# Patient Record
Sex: Male | Born: 1940 | Race: White | Hispanic: No | Marital: Married | Smoking: Former smoker
Health system: Southern US, Community
[De-identification: ages and names within clinical notes are randomized; demographics above are authoritative.]

## PROBLEM LIST (undated history)

## (undated) DIAGNOSIS — E119 Type 2 diabetes mellitus without complications: Secondary | ICD-10-CM

## (undated) DIAGNOSIS — I10 Essential (primary) hypertension: Secondary | ICD-10-CM

## (undated) DIAGNOSIS — N400 Enlarged prostate without lower urinary tract symptoms: Secondary | ICD-10-CM

## (undated) HISTORY — PX: CORONARY ARTERY BYPASS GRAFT: SHX141

## (undated) HISTORY — PX: HEMORRHOID SURGERY: SHX153

## (undated) HISTORY — PX: PROSTATE SURGERY: SHX751

## (undated) HISTORY — PX: TONSILLECTOMY: SUR1361

## (undated) HISTORY — PX: CHOLECYSTECTOMY: SHX55

## (undated) HISTORY — PX: APPENDECTOMY: SHX54

---

## 2016-03-29 ENCOUNTER — Encounter (HOSPITAL_BASED_OUTPATIENT_CLINIC_OR_DEPARTMENT_OTHER): Payer: Self-pay

## 2016-03-29 ENCOUNTER — Emergency Department (HOSPITAL_BASED_OUTPATIENT_CLINIC_OR_DEPARTMENT_OTHER): Payer: Medicare HMO

## 2016-03-29 ENCOUNTER — Emergency Department (HOSPITAL_BASED_OUTPATIENT_CLINIC_OR_DEPARTMENT_OTHER)
Admission: EM | Admit: 2016-03-29 | Discharge: 2016-03-29 | Disposition: A | Discharge status: Home / Self Care | Payer: Medicare HMO | Attending: Emergency Medicine | Admitting: Emergency Medicine

## 2016-03-29 DIAGNOSIS — E1122 Type 2 diabetes mellitus with diabetic chronic kidney disease: Secondary | ICD-10-CM | POA: Diagnosis not present

## 2016-03-29 DIAGNOSIS — Z79899 Other long term (current) drug therapy: Secondary | ICD-10-CM | POA: Insufficient documentation

## 2016-03-29 DIAGNOSIS — R05 Cough: Secondary | ICD-10-CM

## 2016-03-29 DIAGNOSIS — N189 Chronic kidney disease, unspecified: Secondary | ICD-10-CM | POA: Insufficient documentation

## 2016-03-29 DIAGNOSIS — Z87891 Personal history of nicotine dependence: Secondary | ICD-10-CM | POA: Insufficient documentation

## 2016-03-29 DIAGNOSIS — I129 Hypertensive chronic kidney disease with stage 1 through stage 4 chronic kidney disease, or unspecified chronic kidney disease: Secondary | ICD-10-CM | POA: Insufficient documentation

## 2016-03-29 DIAGNOSIS — N289 Disorder of kidney and ureter, unspecified: Secondary | ICD-10-CM

## 2016-03-29 DIAGNOSIS — E1165 Type 2 diabetes mellitus with hyperglycemia: Secondary | ICD-10-CM

## 2016-03-29 DIAGNOSIS — R059 Cough, unspecified: Secondary | ICD-10-CM

## 2016-03-29 HISTORY — DX: Benign prostatic hyperplasia without lower urinary tract symptoms: N40.0

## 2016-03-29 HISTORY — DX: Essential (primary) hypertension: I10

## 2016-03-29 HISTORY — DX: Type 2 diabetes mellitus without complications: E11.9

## 2016-03-29 LAB — BASIC METABOLIC PANEL
ANION GAP: 13 (ref 5–15)
BUN: 29 mg/dL — ABNORMAL HIGH (ref 6–20)
CALCIUM: 8.5 mg/dL — AB (ref 8.9–10.3)
CO2: 27 mmol/L (ref 22–32)
CREATININE: 1.53 mg/dL — AB (ref 0.61–1.24)
Chloride: 97 mmol/L — ABNORMAL LOW (ref 101–111)
GFR, EST AFRICAN AMERICAN: 50 mL/min — AB (ref >60)
GFR, EST NON AFRICAN AMERICAN: 43 mL/min — AB (ref >60)
GLUCOSE: 240 mg/dL — AB (ref 65–99)
Potassium: 3.8 mmol/L (ref 3.5–5.1)
Sodium: 137 mmol/L (ref 135–145)

## 2016-03-29 LAB — CBC WITH DIFFERENTIAL/PLATELET
BASOS PCT: 0 %
Basophils Absolute: 0 10*3/uL (ref 0.0–0.1)
EOS ABS: 0.5 10*3/uL (ref 0.0–0.7)
Eosinophils Relative: 9 %
HEMATOCRIT: 37.6 % — AB (ref 39.0–52.0)
Hemoglobin: 12.5 g/dL — ABNORMAL LOW (ref 13.0–17.0)
LYMPHS ABS: 0.9 10*3/uL (ref 0.7–4.0)
LYMPHS PCT: 15 %
MCH: 29.5 pg (ref 26.0–34.0)
MCHC: 33.2 g/dL (ref 30.0–36.0)
MCV: 88.7 fL (ref 78.0–100.0)
MONO ABS: 0.4 10*3/uL (ref 0.1–1.0)
MYELOCYTES: 2 %
Monocytes Relative: 7 %
Neutro Abs: 4.2 10*3/uL (ref 1.7–7.7)
Neutrophils Relative %: 67 %
PLATELETS: 216 10*3/uL (ref 150–400)
RBC: 4.24 MIL/uL (ref 4.22–5.81)
RDW: 14.5 % (ref 11.5–15.5)
WBC: 6 10*3/uL (ref 4.0–10.5)

## 2016-03-29 LAB — TROPONIN I: Troponin I: <0.03 ng/mL (ref <0.031)

## 2016-03-29 LAB — URINALYSIS, ROUTINE W REFLEX MICROSCOPIC
Bilirubin Urine: NEGATIVE
GLUCOSE, UA: NEGATIVE mg/dL
HGB URINE DIPSTICK: NEGATIVE
Ketones, ur: NEGATIVE mg/dL
Nitrite: NEGATIVE
PH: 5 (ref 5.0–8.0)
PROTEIN: NEGATIVE mg/dL
SPECIFIC GRAVITY, URINE: 1.018 (ref 1.005–1.030)

## 2016-03-29 LAB — URINE MICROSCOPIC-ADD ON

## 2016-03-29 LAB — D-DIMER, QUANTITATIVE: D-Dimer, Quant: 1.15 ug/mL-FEU — ABNORMAL HIGH (ref 0.00–0.50)

## 2016-03-29 LAB — BRAIN NATRIURETIC PEPTIDE: B Natriuretic Peptide: 63.7 pg/mL (ref 0.0–100.0)

## 2016-03-29 MED ORDER — IPRATROPIUM-ALBUTEROL 0.5-2.5 (3) MG/3ML IN SOLN
3.0000 mL | Freq: Once | RESPIRATORY_TRACT | Status: AC
  Administered 2016-03-29: 3 mL via RESPIRATORY_TRACT
  Filled 2016-03-29: qty 3

## 2016-03-29 MED ORDER — BENZONATATE 100 MG PO CAPS
100.0000 mg | ORAL_CAPSULE | Freq: Three times a day (TID) | ORAL | Status: AC
Start: 2016-03-29 — End: ?

## 2016-03-29 MED ORDER — HYDROCOD POLST-CPM POLST ER 10-8 MG/5ML PO SUER: 5.0000 mL | Freq: Two times a day (BID) | ORAL | Status: DC

## 2016-03-29 MED ORDER — ALBUTEROL SULFATE HFA 108 (90 BASE) MCG/ACT IN AERS
2.0000 | INHALATION_SPRAY | Freq: Once | RESPIRATORY_TRACT | Status: AC
  Administered 2016-03-29: 2 via RESPIRATORY_TRACT
  Filled 2016-03-29: qty 6.7

## 2016-03-29 NOTE — ED Notes (Signed)
C/o prod cough x 10 days-NAD-steady gait

## 2016-03-29 NOTE — ED Provider Notes (Signed)
CSN: [id number]     Arrival date & time 03/29/16  1139 History   First MD Initiated Contact with Patient 03/29/16 1145     Chief Complaint  Patient presents with  . Cough   PT HAS HAD A COUGH FOR THE PAST 10 DAYS.  PT HAS BEEN ON CEFDINIR FOR A PROSTATE INFECTION.  PT SAID THAT HIS COUGH KEEPS HIM UP AT NIGHT.  PT TRIED HIS SISTER'S COUGH SYRUP WITH CODEINE AND THAT HAS NOT HELPED.  (Consider location/radiation/quality/duration/timing/severity/associated sxs/prior Treatment) Patient is a 75 y.o. male presenting with cough. The history is provided by the patient.  Cough Cough characteristics:  Productive Sputum characteristics:  Nondescript Severity:  Moderate Onset quality:  Gradual Timing:  Intermittent Progression:  Waxing and waning Chronicity:  New Smoker: no   Relieved by:  Nothing Ineffective treatments:  Cough suppressants and opioids   Past Medical History  Diagnosis Date  . Hypertension   . Enlarged prostate   . Diabetes mellitus without complication [facility])    Past Surgical History  Procedure Laterality Date  . Coronary artery bypass graft    . Prostate surgery    . Appendectomy    . Tonsillectomy    . Cholecystectomy    . Hemorrhoid surgery     No family history on file. Social History  Substance Use Topics  . Smoking status: Former Research scientist (life sciences)  . Smokeless tobacco: None  . Alcohol Use: No    Review of Systems  Respiratory: Positive for cough.   All other systems reviewed and are negative.     Allergies  Review of patient's allergies indicates no known allergies.  Home Medications   Prior to Admission medications   Medication Sig Start Date End Date Taking? Authorizing Provider  benzonatate (TESSALON) 100 MG capsule Take 1 capsule (100 mg total) by mouth every 8 (eight) hours. 03/29/16   Isla Pence, MD  chlorpheniramine-HYDROcodone [facility] PENNKINETIC ER) 10-8 MG/5ML SUER Take 5 mLs by mouth 2 (two) times daily. 03/29/16   Isla Pence, MD   UNKNOWN TO PATIENT Pt states he is on a lot of meds and did not bring list   Yes Historical Provider, MD   BP 124/80 mmHg  Pulse 92  Temp(Src) 98.9 F (37.2 C) (Oral)  Resp 22  Ht 5\' 9"  (1.753 m)  Wt 230 lb (104.327 kg)  BMI 33.95 kg/m2  SpO2 93% Physical Exam  Constitutional: He is oriented to person, place, and time. He appears well-developed and well-nourished.  HENT:  Head: Normocephalic and atraumatic.  Right Ear: External ear normal.  Left Ear: External ear normal.  Nose: Nose normal.  Mouth/Throat: Oropharynx is clear and moist.  Eyes: Conjunctivae and EOM are normal. Pupils are equal, round, and reactive to light.  Neck: Normal range of motion. Neck supple.  Cardiovascular: Normal rate, regular rhythm, normal heart sounds and intact distal pulses.   Pulmonary/Chest: Effort normal and breath sounds normal.  Abdominal: Soft. Bowel sounds are normal.  Musculoskeletal: Normal range of motion.  Neurological: He is alert and oriented to person, place, and time.  Skin: Skin is warm and dry.  Psychiatric: He has a normal mood and affect. His behavior is normal. Judgment and thought content normal.  Nursing note and vitals reviewed.   ED Course  Procedures (including critical care time) Labs Review Labs Reviewed  BASIC METABOLIC PANEL - Abnormal; Notable for the following:    Chloride 97 (*)    Glucose, Bld 240 (*)    BUN 29 (*)  Creatinine, Ser 1.53 (*)    Calcium 8.5 (*)    GFR calc non Af Amer 43 (*)    GFR calc Af Amer 50 (*)    All other components within normal limits  CBC WITH DIFFERENTIAL/PLATELET - Abnormal; Notable for the following:    Hemoglobin 12.5 (*)    HCT 37.6 (*)    All other components within normal limits  D-DIMER, QUANTITATIVE (NOT AT [facility]) - Abnormal; Notable for the following:    D-Dimer, Quant 1.15 (*)    All other components within normal limits  URINALYSIS, ROUTINE W REFLEX MICROSCOPIC (NOT AT [facility]) - Abnormal; Notable for the  following:    Leukocytes, UA MODERATE (*)    All other components within normal limits  URINE MICROSCOPIC-ADD ON - Abnormal; Notable for the following:    Squamous Epithelial / LPF 0-5 (*)    Bacteria, UA FEW (*)    All other components within normal limits  BRAIN NATRIURETIC PEPTIDE  TROPONIN I    Imaging Review Dg Chest 2 View  03/29/2016  CLINICAL DATA:  Productive cough, congestion for 10 days EXAM: CHEST  2 VIEW COMPARISON:  None. FINDINGS: There is no focal parenchymal opacity. There is no pleural effusion or pneumothorax. The heart and mediastinal contours are unremarkable. There is evidence of prior CABG. The osseous structures are unremarkable. IMPRESSION: No active cardiopulmonary disease. Electronically Signed   By: Kathreen Devoid   On: 03/29/2016 12:29   Ct Chest Wo Contrast  03/29/2016  CLINICAL DATA:  Productive cough for 10 days EXAM: CT CHEST WITHOUT CONTRAST TECHNIQUE: Multidetector CT imaging of the chest was performed following the standard protocol without IV contrast. COMPARISON:  Plain film from earlier in the same day. FINDINGS: Lungs are well aerated bilaterally. No focal infiltrate or sizable effusion is noted. The thoracic inlet demonstrates a small left thyroid nodule measuring less than 1 cm. Mild calcifications of the thoracic aorta are seen. Changes of prior coronary bypass grafting are noted. No hilar or mediastinal adenopathy is seen. The visualized upper abdomen is within normal limits. The osseous structures show degenerative change of the thoracic spine. IMPRESSION: No acute abnormality noted. Small left thyroid nodule. Electronically Signed   By: Inez Catalina M.D.   On: 03/29/2016 13:26   I have personally reviewed and evaluated these images and lab results as part of my medical decision-making.   EKG Interpretation   Date/Time:  Monday Mar 29 2016 12:13:00 EDT Ventricular Rate:  89 PR Interval:    QRS Duration: 129 QT Interval:  401 QTC Calculation:  488 R Axis:   78 Text Interpretation:  NSR WITH PACS Confirmed by Gilford Raid MD, Anadia Helmes  ([id number]) on 03/29/2016 12:22:34 PM Also confirmed by [facility] MD, Aniza Shor  [id number]), editor [place] CT, Leda Gauze [phone])  on 03/29/2016 2:00:23 PM      MDM  PT FEELS BETTER.  HE HAS NO SOB.  NO PERSISTENT COUGH.  THIS HAS BEEN GOING ON FOR DAYS.  HE DOES NOT KNOW HIS MEDS, SO I DON'T KNOW IF HE'S ON AN ACE.  D-DIMER WAS ELEVATED, BUT CRI PREVENTS CT WITH CONTRAST.  I HAVE A LOW SUSPICION FOR A PE, SO I WILL ORDER A VQ SCAN AS AN OUTPATIENT.  PT KNOWS TO RETURN IF WORSE AND TO F/U WITH PCP. Final diagnoses:  Cough  CRI (chronic renal insufficiency), unspecified stage  Poorly controlled type 2 diabetes mellitus (HCC)      Isla Pence, MD 03/29/16 1456

## 2016-03-29 NOTE — Discharge Instructions (Signed)

## 2020-10-16 ENCOUNTER — Encounter (HOSPITAL_BASED_OUTPATIENT_CLINIC_OR_DEPARTMENT_OTHER): Payer: Self-pay | Admitting: Emergency Medicine

## 2020-10-16 ENCOUNTER — Emergency Department (HOSPITAL_BASED_OUTPATIENT_CLINIC_OR_DEPARTMENT_OTHER): Payer: Medicare HMO

## 2020-10-16 ENCOUNTER — Other Ambulatory Visit: Payer: Self-pay

## 2020-10-16 ENCOUNTER — Inpatient Hospital Stay (HOSPITAL_BASED_OUTPATIENT_CLINIC_OR_DEPARTMENT_OTHER)
Admission: EM | Admit: 2020-10-16 | Discharge: 2020-10-20 | DRG: 177 | Disposition: A | Discharge status: Home / Self Care | Payer: Medicare HMO | Attending: Internal Medicine | Admitting: Internal Medicine

## 2020-10-16 DIAGNOSIS — I10 Essential (primary) hypertension: Secondary | ICD-10-CM | POA: Diagnosis present

## 2020-10-16 DIAGNOSIS — E872 Acidosis: Secondary | ICD-10-CM | POA: Diagnosis present

## 2020-10-16 DIAGNOSIS — Z7901 Long term (current) use of anticoagulants: Secondary | ICD-10-CM

## 2020-10-16 DIAGNOSIS — K921 Melena: Secondary | ICD-10-CM | POA: Diagnosis present

## 2020-10-16 DIAGNOSIS — R197 Diarrhea, unspecified: Secondary | ICD-10-CM | POA: Diagnosis present

## 2020-10-16 DIAGNOSIS — U071 COVID-19: Secondary | ICD-10-CM | POA: Diagnosis present

## 2020-10-16 DIAGNOSIS — C61 Malignant neoplasm of prostate: Secondary | ICD-10-CM | POA: Diagnosis present

## 2020-10-16 DIAGNOSIS — Z794 Long term (current) use of insulin: Secondary | ICD-10-CM | POA: Diagnosis not present

## 2020-10-16 DIAGNOSIS — M069 Rheumatoid arthritis, unspecified: Secondary | ICD-10-CM | POA: Diagnosis present

## 2020-10-16 DIAGNOSIS — R9431 Abnormal electrocardiogram [ECG] [EKG]: Secondary | ICD-10-CM | POA: Diagnosis not present

## 2020-10-16 DIAGNOSIS — Z7984 Long term (current) use of oral hypoglycemic drugs: Secondary | ICD-10-CM | POA: Diagnosis not present

## 2020-10-16 DIAGNOSIS — Z6833 Body mass index (BMI) 33.0-33.9, adult: Secondary | ICD-10-CM

## 2020-10-16 DIAGNOSIS — R0902 Hypoxemia: Secondary | ICD-10-CM | POA: Diagnosis present

## 2020-10-16 DIAGNOSIS — E669 Obesity, unspecified: Secondary | ICD-10-CM | POA: Diagnosis present

## 2020-10-16 DIAGNOSIS — J9601 Acute respiratory failure with hypoxia: Secondary | ICD-10-CM | POA: Diagnosis present

## 2020-10-16 DIAGNOSIS — I4892 Unspecified atrial flutter: Secondary | ICD-10-CM | POA: Diagnosis not present

## 2020-10-16 DIAGNOSIS — E1165 Type 2 diabetes mellitus with hyperglycemia: Secondary | ICD-10-CM | POA: Diagnosis present

## 2020-10-16 DIAGNOSIS — J9811 Atelectasis: Secondary | ICD-10-CM | POA: Diagnosis present

## 2020-10-16 DIAGNOSIS — Z79899 Other long term (current) drug therapy: Secondary | ICD-10-CM | POA: Diagnosis not present

## 2020-10-16 DIAGNOSIS — I2510 Atherosclerotic heart disease of native coronary artery without angina pectoris: Secondary | ICD-10-CM | POA: Diagnosis present

## 2020-10-16 DIAGNOSIS — Z87891 Personal history of nicotine dependence: Secondary | ICD-10-CM | POA: Diagnosis not present

## 2020-10-16 DIAGNOSIS — Z951 Presence of aortocoronary bypass graft: Secondary | ICD-10-CM

## 2020-10-16 LAB — RESP PANEL BY RT-PCR (FLU A&B, COVID) ARPGX2
Influenza A by PCR: NEGATIVE
Influenza B by PCR: NEGATIVE
SARS Coronavirus 2 by RT PCR: POSITIVE — AB

## 2020-10-16 LAB — CBC WITH DIFFERENTIAL/PLATELET
Abs Immature Granulocytes: 0.02 10*3/uL (ref 0.00–0.07)
Basophils Absolute: 0 10*3/uL (ref 0.0–0.1)
Basophils Relative: 1 %
Eosinophils Absolute: 0.2 10*3/uL (ref 0.0–0.5)
Eosinophils Relative: 7 %
HCT: 33.8 % — ABNORMAL LOW (ref 39.0–52.0)
Hemoglobin: 11.2 g/dL — ABNORMAL LOW (ref 13.0–17.0)
Immature Granulocytes: 1 %
Lymphocytes Relative: 28 %
Lymphs Abs: 0.7 10*3/uL (ref 0.7–4.0)
MCH: 29.8 pg (ref 26.0–34.0)
MCHC: 33.1 g/dL (ref 30.0–36.0)
MCV: 89.9 fL (ref 80.0–100.0)
Monocytes Absolute: 0.3 10*3/uL (ref 0.1–1.0)
Monocytes Relative: 11 %
Neutro Abs: 1.3 10*3/uL — ABNORMAL LOW (ref 1.7–7.7)
Neutrophils Relative %: 52 %
Platelets: 140 10*3/uL — ABNORMAL LOW (ref 150–400)
RBC: 3.76 MIL/uL — ABNORMAL LOW (ref 4.22–5.81)
RDW: 15.3 % (ref 11.5–15.5)
WBC: 2.5 10*3/uL — ABNORMAL LOW (ref 4.0–10.5)
nRBC: 0 % (ref 0.0–0.2)

## 2020-10-16 LAB — D-DIMER, QUANTITATIVE: D-Dimer, Quant: 0.57 ug/mL-FEU — ABNORMAL HIGH (ref 0.00–0.50)

## 2020-10-16 LAB — COMPREHENSIVE METABOLIC PANEL
ALT: 58 U/L — ABNORMAL HIGH (ref 0–44)
AST: 73 U/L — ABNORMAL HIGH (ref 15–41)
Albumin: 3.8 g/dL (ref 3.5–5.0)
Alkaline Phosphatase: 63 U/L (ref 38–126)
Anion gap: 13 (ref 5–15)
BUN: 26 mg/dL — ABNORMAL HIGH (ref 8–23)
CO2: 35 mmol/L — ABNORMAL HIGH (ref 22–32)
Calcium: 8.6 mg/dL — ABNORMAL LOW (ref 8.9–10.3)
Chloride: 89 mmol/L — ABNORMAL LOW (ref 98–111)
Creatinine, Ser: 1.44 mg/dL — ABNORMAL HIGH (ref 0.61–1.24)
GFR, Estimated: 50 mL/min — ABNORMAL LOW (ref >60)
Glucose, Bld: 231 mg/dL — ABNORMAL HIGH (ref 70–99)
Potassium: 4 mmol/L (ref 3.5–5.1)
Sodium: 137 mmol/L (ref 135–145)
Total Bilirubin: 0.6 mg/dL (ref 0.3–1.2)
Total Protein: 7.3 g/dL (ref 6.5–8.1)

## 2020-10-16 LAB — FERRITIN: Ferritin: 227 ng/mL (ref 24–336)

## 2020-10-16 LAB — PROCALCITONIN: Procalcitonin: <0.1 ng/mL

## 2020-10-16 LAB — FIBRINOGEN: Fibrinogen: 538 mg/dL — ABNORMAL HIGH (ref 210–475)

## 2020-10-16 LAB — LACTIC ACID, PLASMA
Lactic Acid, Venous: 2.3 mmol/L (ref 0.5–1.9)
Lactic Acid, Venous: 1.9 mmol/L (ref 0.5–1.9)

## 2020-10-16 LAB — TRIGLYCERIDES: Triglycerides: 200 mg/dL — ABNORMAL HIGH (ref <150)

## 2020-10-16 LAB — LACTATE DEHYDROGENASE: LDH: 182 U/L (ref 98–192)

## 2020-10-16 LAB — C-REACTIVE PROTEIN: CRP: 6.2 mg/dL — ABNORMAL HIGH (ref <1.0)

## 2020-10-16 MED ORDER — SODIUM CHLORIDE 0.9 % IV SOLN
100.0000 mg | Freq: Every day | INTRAVENOUS | Status: AC
  Administered 2020-10-17 – 2020-10-20 (×5): 100 mg via INTRAVENOUS
  Filled 2020-10-16 (×3): qty 20

## 2020-10-16 MED ORDER — SODIUM CHLORIDE 0.9 % IV SOLN: INTRAVENOUS | Status: DC | PRN

## 2020-10-16 MED ORDER — DEXAMETHASONE SODIUM PHOSPHATE 10 MG/ML IJ SOLN
6.0000 mg | Freq: Once | INTRAMUSCULAR | Status: AC
  Administered 2020-10-16: 15:00:00 6 mg via INTRAVENOUS
  Filled 2020-10-16: qty 1

## 2020-10-16 MED ORDER — SODIUM CHLORIDE 0.9 % IV SOLN
100.0000 mg | Freq: Once | INTRAVENOUS | Status: AC
  Administered 2020-10-16: 17:00:00 100 mg via INTRAVENOUS
  Filled 2020-10-16: qty 20

## 2020-10-16 MED ORDER — SODIUM CHLORIDE 0.9 % IV SOLN
100.0000 mg | Freq: Once | INTRAVENOUS | Status: AC
  Administered 2020-10-16: 16:00:00 100 mg via INTRAVENOUS
  Filled 2020-10-16: qty 20

## 2020-10-16 NOTE — ED Provider Notes (Signed)
[facility] Provider Note   CSN: [id number] Arrival date & time: 10/16/20  1340     History Chief Complaint  Patient presents with  . Shortness of Breath    Covid +    Chad Bullock is a 79 y.o. male presenting for evaluation of shortness of breath and cough.  Patient states that symptoms began 9 days ago on December 8.  He tested positive for Covid on that day.  He reports nonproductive cough, shortness of breath, and significant diarrhea.  He does report blood in his stool, he is on Eliquis.  He denies known fevers, chest pain, nausea, vomiting, abdominal pain, urinary symptoms.  He has not received the Covid vaccine.  His wife is also sick and positive for Covid.  He denies history of lung problems such as COPD or asthma.  He does not smoke cigarettes.  Patient states he is here today because his oxygen saturations have been low at home for the past 2 days, in the 80s.   Additional history obtained from chart review.  Patient with a history of diabetes, hypertension, BPH  HPI     Past Medical History:  Diagnosis Date  . Diabetes mellitus without complication ([place])   . Enlarged prostate   . Hypertension     Patient Active Problem List   Diagnosis Date Noted  . Pneumonia due to COVID-19 virus 10/16/2020    Past Surgical History:  Procedure Laterality Date  . APPENDECTOMY    . CHOLECYSTECTOMY    . CORONARY ARTERY BYPASS GRAFT    . HEMORRHOID SURGERY    . PROSTATE SURGERY    . TONSILLECTOMY         No family history on file.  Social History   Tobacco Use  . Smoking status: Former Research scientist (life sciences)  . Smokeless tobacco: Never Used  Substance Use Topics  . Alcohol use: No    Home Medications Prior to Admission medications   Medication Sig Start Date End Date Taking? Authorizing Provider  apixaban (ELIQUIS) 2.5 MG TABS tablet Take by mouth 2 (two) times daily.    [provider]  benzonatate (TESSALON) 100 MG capsule Take 1  capsule (100 mg total) by mouth every 8 (eight) hours. 03/29/16   Isla Pence, MD  chlorpheniramine-HYDROcodone (TUSSIONEX PENNKINETIC ER) 10-8 MG/5ML SUER Take 5 mLs by mouth 2 (two) times daily. 03/29/16   Isla Pence, MD  diclofenac (VOLTAREN) 75 MG EC tablet Take 75 mg by mouth 2 (two) times daily.    [provider]  Dulaglutide (TRULICITY) 1.5 HA/5.7XU SOPN Inject 1.5 mg into the skin daily.    [provider]  gabapentin (NEURONTIN) 100 MG capsule Take 100 mg by mouth 3 (three) times daily.    [provider]  glipiZIDE (GLUCOTROL) 5 MG tablet Take 5 mg by mouth 2 (two) times daily before a meal.    [provider]  hydrochlorothiazide (MICROZIDE) 12.5 MG capsule Take 12.5 mg by mouth daily.    [provider]  hydroxychloroquine (PLAQUENIL) 200 MG tablet Take 400 mg by mouth daily.    [provider]  Insulin Glargine (TOUJEO MAX SOLOSTAR [place]) Inject 16 Units into the skin daily.    [provider]  lisinopril (ZESTRIL) 20 MG tablet Take 20 mg by mouth daily.    [provider]  metFORMIN (GLUCOPHAGE-XR) 500 MG 24 hr tablet Take 500 mg by mouth daily with breakfast.    [provider]  metoprolol succinate (TOPROL-XL) 50  MG 24 hr tablet Take 50 mg by mouth daily. Take with or immediately following a meal.    [provider]  simvastatin (ZOCOR) 20 MG tablet Take 20 mg by mouth daily.    [provider]  UNKNOWN TO PATIENT Pt states he is on a lot of meds and did not bring list    [provider]    Allergies    Iodinated diagnostic agents and Amoxicillin-pot clavulanate  Review of Systems   Review of Systems  Respiratory: Positive for cough and shortness of breath.   Gastrointestinal: Positive for blood in stool and diarrhea.  Hematological: Bruises/bleeds easily.  All other systems reviewed and are negative.   Physical Exam Updated Vital Signs BP 135/72   Pulse 85    Temp 99 F (37.2 C) (Oral)   Resp (!) 22   Ht 5\' 9"  (1.753 m)   Wt 102.1 kg   SpO2 94%   BMI 33.23 kg/m   Physical Exam Vitals and nursing note reviewed.  Constitutional:      General: He is not in acute distress.    Appearance: He is well-developed and well-nourished.     Comments: Appears nontoxic  HENT:     Head: Normocephalic and atraumatic.  Eyes:     Extraocular Movements: EOM normal.     Conjunctiva/sclera: Conjunctivae normal.     Pupils: Pupils are equal, round, and reactive to light.  Cardiovascular:     Rate and Rhythm: Normal rate and regular rhythm.     Pulses: Normal pulses and intact distal pulses.  Pulmonary:     Effort: Pulmonary effort is normal. No respiratory distress.     Breath sounds: No wheezing.     Comments: SPO2 82 to 84% on my evaluation on room air.  Speaking in full sentences.  Clear lung sounds.  On 2 L via nasal cannula, SPO2 increases to mid 90s. Abdominal:     General: There is no distension.     Palpations: Abdomen is soft. There is no mass.     Tenderness: There is no abdominal tenderness. There is no guarding or rebound.  Musculoskeletal:        General: Normal range of motion.     Cervical back: Normal range of motion and neck supple.     Right lower leg: No edema.     Left lower leg: No edema.  Skin:    General: Skin is warm and dry.     Capillary Refill: Capillary refill takes less than 2 seconds.  Neurological:     Mental Status: He is alert and oriented to person, place, and time.  Psychiatric:        Mood and Affect: Mood and affect normal.     ED Results / Procedures / Treatments   Labs (all labs ordered are listed, but only abnormal results are displayed) Labs Reviewed  RESP PANEL BY RT-PCR (FLU A&B, COVID) ARPGX2 - Abnormal; Notable for the following components:      Result Value   SARS Coronavirus 2 by RT PCR POSITIVE (*)    All other components within normal limits  LACTIC ACID, PLASMA - Abnormal; Notable for the  following components:   Lactic Acid, Venous 2.3 (*)    All other components within normal limits  CBC WITH DIFFERENTIAL/PLATELET - Abnormal; Notable for the following components:   WBC 2.5 (*)    RBC 3.76 (*)    Hemoglobin 11.2 (*)    HCT 33.8 (*)  Platelets 140 (*)    Neutro Abs 1.3 (*)    All other components within normal limits  COMPREHENSIVE METABOLIC PANEL - Abnormal; Notable for the following components:   Chloride 89 (*)    CO2 35 (*)    Glucose, Bld 231 (*)    BUN 26 (*)    Creatinine, Ser 1.44 (*)    Calcium 8.6 (*)    AST 73 (*)    ALT 58 (*)    GFR, Estimated 50 (*)    All other components within normal limits  D-DIMER, QUANTITATIVE (NOT AT [facility]) - Abnormal; Notable for the following components:   D-Dimer, Quant 0.57 (*)    All other components within normal limits  CULTURE, BLOOD (ROUTINE X 2)  CULTURE, BLOOD (ROUTINE X 2)  LACTIC ACID, PLASMA  PROCALCITONIN  LACTATE DEHYDROGENASE  FERRITIN  TRIGLYCERIDES  FIBRINOGEN  C-REACTIVE PROTEIN    EKG EKG Interpretation  Date/Time:  Thursday October 16 2020 14:07:34 EST Ventricular Rate:  85 PR Interval:    QRS Duration: 125 QT Interval:  429 QTC Calculation: 511 R Axis:   78 Text Interpretation: Sinus or ectopic atrial rhythm IVCD, consider atypical RBBB No acute changes No significant change since last tracing Confirmed by Varney Biles ([id number]) on 10/16/2020 2:21:34 PM   Radiology DG Chest Port 1 View  Result Date: 10/16/2020 CLINICAL DATA:  Shortness of breath. EXAM: PORTABLE CHEST 1 VIEW COMPARISON:  None. FINDINGS: The heart size and mediastinal contours are within normal limits. Sternotomy wires are noted. No pneumothorax or pleural effusion is noted. Both lungs are clear. The visualized skeletal structures are unremarkable. IMPRESSION: No active disease. Electronically Signed   By: Marijo Conception M.D.   On: 10/16/2020 14:44    Procedures Procedures (including critical care  time)  Medications Ordered in ED Medications  remdesivir 100 mg in sodium chloride 0.9 % 100 mL IVPB (100 mg Intravenous New Bag/Given 10/16/20 1557)    Followed by  remdesivir 100 mg in sodium chloride 0.9 % 100 mL IVPB (has no administration in time range)    Followed by  remdesivir 100 mg in sodium chloride 0.9 % 100 mL IVPB (has no administration in time range)  0.9 %  sodium chloride infusion ( Intravenous New Bag/Given 10/16/20 1553)  dexamethasone (DECADRON) injection 6 mg (6 mg Intravenous Given 10/16/20 1518)    ED Course  I have reviewed the triage vital signs and the nursing notes.  Pertinent labs & imaging results that were available during my care of the patient were reviewed by me and considered in my medical decision making (see chart for details).    MDM Rules/Calculators/A&P                          Patient presenting for evaluation of shortness of breath, cough, and low oxygen in the setting of Covid infection.  On exam, patient appears nontoxic.  However he is hypoxic on room air.  This improves quickly with nasal cannula.  Will check labs to look for electrolyte and hemoglobin abnormality.  Obtain chest x-ray.  Patient likely need to be admitted for hypoxia.  Labs interpreted by me, consistent with Covid.  Does show mildly elevated LFTs.  Leukopenia of 2.5.  Hemoglobin is stable.  Electrolytes stable.  Chest x-ray viewed interpreted by me, no pneumonia pneumothorax, effusion.  Patient's Covid test is positive in the ED.  Decadron given.  Will call for admission.  Discussed with  Dr. Dwyane Dee from [facility] hospital[facility], patient to be admitted. requesting remdesivir.   Final Clinical Impression(s) / ED Diagnoses Final diagnoses:  [id number]  Hypoxia    Rx / DC Orders ED Discharge Orders    None       Franchot Heidelberg, PA-C 10/16/20 [place], MD 10/24/20 2300

## 2020-10-16 NOTE — ED Triage Notes (Signed)
Pt has covid.  Tested positive on Sunday.  Symptoms started on 10/08/2020.  Pt having sob on exertion.  Low saturation 2 days ago.

## 2020-10-17 ENCOUNTER — Encounter (HOSPITAL_COMMUNITY): Payer: Self-pay | Admitting: Family Medicine

## 2020-10-17 DIAGNOSIS — C61 Malignant neoplasm of prostate: Secondary | ICD-10-CM

## 2020-10-17 DIAGNOSIS — M069 Rheumatoid arthritis, unspecified: Secondary | ICD-10-CM

## 2020-10-17 DIAGNOSIS — E1165 Type 2 diabetes mellitus with hyperglycemia: Secondary | ICD-10-CM

## 2020-10-17 DIAGNOSIS — I2510 Atherosclerotic heart disease of native coronary artery without angina pectoris: Secondary | ICD-10-CM

## 2020-10-17 DIAGNOSIS — R9431 Abnormal electrocardiogram [ECG] [EKG]: Secondary | ICD-10-CM

## 2020-10-17 DIAGNOSIS — I10 Essential (primary) hypertension: Secondary | ICD-10-CM

## 2020-10-17 DIAGNOSIS — Z794 Long term (current) use of insulin: Secondary | ICD-10-CM

## 2020-10-17 DIAGNOSIS — J9601 Acute respiratory failure with hypoxia: Secondary | ICD-10-CM

## 2020-10-17 DIAGNOSIS — U071 COVID-19: Principal | ICD-10-CM

## 2020-10-17 DIAGNOSIS — I4892 Unspecified atrial flutter: Secondary | ICD-10-CM

## 2020-10-17 HISTORY — DX: Essential (primary) hypertension: I10

## 2020-10-17 HISTORY — DX: Malignant neoplasm of prostate: C61

## 2020-10-17 HISTORY — DX: Atherosclerotic heart disease of native coronary artery without angina pectoris: I25.10

## 2020-10-17 HISTORY — DX: Rheumatoid arthritis, unspecified: M06.9

## 2020-10-17 HISTORY — DX: Type 2 diabetes mellitus with hyperglycemia: E11.65

## 2020-10-17 LAB — HEMOGLOBIN A1C
Hgb A1c MFr Bld: 7.6 % — ABNORMAL HIGH (ref 4.8–5.6)
Mean Plasma Glucose: 171.42 mg/dL

## 2020-10-17 LAB — GLUCOSE, CAPILLARY
Glucose-Capillary: 228 mg/dL — ABNORMAL HIGH (ref 70–99)
Glucose-Capillary: 241 mg/dL — ABNORMAL HIGH (ref 70–99)
Glucose-Capillary: 235 mg/dL — ABNORMAL HIGH (ref 70–99)
Glucose-Capillary: 253 mg/dL — ABNORMAL HIGH (ref 70–99)

## 2020-10-17 MED ORDER — LACTATED RINGERS IV SOLN: INTRAVENOUS | Status: AC

## 2020-10-17 MED ORDER — METOPROLOL SUCCINATE ER 50 MG PO TB24
50.0000 mg | ORAL_TABLET | Freq: Every day | ORAL | Status: DC
  Administered 2020-10-17 – 2020-10-20 (×3): 50 mg via ORAL
  Filled 2020-10-17 (×4): qty 1

## 2020-10-17 MED ORDER — HYDROCHLOROTHIAZIDE 12.5 MG PO CAPS
12.5000 mg | ORAL_CAPSULE | Freq: Every day | ORAL | Status: DC
  Administered 2020-10-17 – 2020-10-20 (×4): 12.5 mg via ORAL
  Filled 2020-10-17 (×4): qty 1

## 2020-10-17 MED ORDER — POLYETHYLENE GLYCOL 3350 17 G PO PACK: 17.0000 g | PACK | Freq: Every day | ORAL | Status: DC | PRN

## 2020-10-17 MED ORDER — ASCORBIC ACID 500 MG PO TABS
500.0000 mg | ORAL_TABLET | Freq: Every day | ORAL | Status: DC
  Administered 2020-10-17 – 2020-10-20 (×4): 500 mg via ORAL
  Filled 2020-10-17 (×4): qty 1

## 2020-10-17 MED ORDER — LISINOPRIL 20 MG PO TABS
20.0000 mg | ORAL_TABLET | Freq: Every day | ORAL | Status: DC
  Administered 2020-10-17 – 2020-10-20 (×4): 20 mg via ORAL
  Filled 2020-10-17 (×4): qty 1

## 2020-10-17 MED ORDER — INSULIN GLARGINE 100 UNIT/ML ~~LOC~~ SOLN
16.0000 [IU] | Freq: Every day | SUBCUTANEOUS | Status: DC
  Administered 2020-10-17: 10:00:00 16 [IU] via SUBCUTANEOUS
  Filled 2020-10-17: qty 0.16

## 2020-10-17 MED ORDER — DICLOFENAC SODIUM 75 MG PO TBEC
75.0000 mg | DELAYED_RELEASE_TABLET | Freq: Two times a day (BID) | ORAL | Status: DC
  Administered 2020-10-17 – 2020-10-20 (×8): 75 mg via ORAL
  Filled 2020-10-17 (×8): qty 1

## 2020-10-17 MED ORDER — ACETAMINOPHEN 325 MG PO TABS: 650.0000 mg | ORAL_TABLET | Freq: Four times a day (QID) | ORAL | Status: DC | PRN

## 2020-10-17 MED ORDER — SIMVASTATIN 20 MG PO TABS
20.0000 mg | ORAL_TABLET | Freq: Every day | ORAL | Status: DC
  Administered 2020-10-17 – 2020-10-20 (×4): 20 mg via ORAL
  Filled 2020-10-17 (×4): qty 1

## 2020-10-17 MED ORDER — INSULIN ASPART 100 UNIT/ML ~~LOC~~ SOLN
0.0000 [IU] | Freq: Three times a day (TID) | SUBCUTANEOUS | Status: DC
  Administered 2020-10-17: 10:00:00 3 [IU] via SUBCUTANEOUS
  Administered 2020-10-17: 21:00:00 8 [IU] via SUBCUTANEOUS
  Administered 2020-10-17 (×2): 5 [IU] via SUBCUTANEOUS
  Administered 2020-10-18: 09:00:00 8 [IU] via SUBCUTANEOUS
  Administered 2020-10-18: 12:00:00 11 [IU] via SUBCUTANEOUS

## 2020-10-17 MED ORDER — HYDROXYCHLOROQUINE SULFATE 200 MG PO TABS
400.0000 mg | ORAL_TABLET | Freq: Two times a day (BID) | ORAL | Status: DC
  Administered 2020-10-17 (×2): 400 mg via ORAL
  Filled 2020-10-17 (×2): qty 2

## 2020-10-17 MED ORDER — ONDANSETRON HCL 4 MG/2ML IJ SOLN: 4.0000 mg | Freq: Four times a day (QID) | INTRAMUSCULAR | Status: DC | PRN

## 2020-10-17 MED ORDER — ZINC SULFATE 220 (50 ZN) MG PO CAPS
220.0000 mg | ORAL_CAPSULE | Freq: Every day | ORAL | Status: DC
  Administered 2020-10-17 – 2020-10-20 (×4): 220 mg via ORAL
  Filled 2020-10-17 (×4): qty 1

## 2020-10-17 MED ORDER — INSULIN GLARGINE 100 UNIT/ML ~~LOC~~ SOLN
16.0000 [IU] | Freq: Two times a day (BID) | SUBCUTANEOUS | Status: DC
  Administered 2020-10-17 – 2020-10-20 (×6): 16 [IU] via SUBCUTANEOUS
  Filled 2020-10-17 (×7): qty 0.16

## 2020-10-17 MED ORDER — ONDANSETRON HCL 4 MG PO TABS: 4.0000 mg | ORAL_TABLET | Freq: Four times a day (QID) | ORAL | Status: DC | PRN

## 2020-10-17 MED ORDER — DEXAMETHASONE SODIUM PHOSPHATE 10 MG/ML IJ SOLN
6.0000 mg | INTRAMUSCULAR | Status: DC
  Administered 2020-10-17 – 2020-10-18 (×2): 6 mg via INTRAVENOUS
  Filled 2020-10-17 (×2): qty 1

## 2020-10-17 MED ORDER — HYDROCOD POLST-CPM POLST ER 10-8 MG/5ML PO SUER
5.0000 mL | Freq: Two times a day (BID) | ORAL | Status: DC | PRN
  Administered 2020-10-17 – 2020-10-19 (×2): 5 mL via ORAL
  Filled 2020-10-17 (×2): qty 5

## 2020-10-17 MED ORDER — ALBUTEROL SULFATE HFA 108 (90 BASE) MCG/ACT IN AERS: 2.0000 | INHALATION_SPRAY | RESPIRATORY_TRACT | Status: DC | PRN

## 2020-10-17 MED ORDER — APIXABAN 5 MG PO TABS
5.0000 mg | ORAL_TABLET | Freq: Two times a day (BID) | ORAL | Status: DC
  Administered 2020-10-17 – 2020-10-20 (×6): 5 mg via ORAL
  Filled 2020-10-17: qty 2
  Filled 2020-10-17 (×5): qty 1

## 2020-10-17 MED ORDER — GUAIFENESIN-DM 100-10 MG/5ML PO SYRP: 10.0000 mL | ORAL_SOLUTION | ORAL | Status: DC | PRN

## 2020-10-17 MED ORDER — PROCHLORPERAZINE EDISYLATE 10 MG/2ML IJ SOLN: 10.0000 mg | Freq: Four times a day (QID) | INTRAMUSCULAR | Status: DC | PRN

## 2020-10-17 MED ORDER — HYDROXYCHLOROQUINE SULFATE 200 MG PO TABS
200.0000 mg | ORAL_TABLET | Freq: Two times a day (BID) | ORAL | Status: DC
  Administered 2020-10-17 – 2020-10-20 (×6): 200 mg via ORAL
  Filled 2020-10-17 (×6): qty 1

## 2020-10-17 MED ORDER — GABAPENTIN 100 MG PO CAPS
100.0000 mg | ORAL_CAPSULE | Freq: Three times a day (TID) | ORAL | Status: DC
  Administered 2020-10-17 – 2020-10-20 (×10): 100 mg via ORAL
  Filled 2020-10-17 (×10): qty 1

## 2020-10-17 MED ORDER — APIXABAN 2.5 MG PO TABS
2.5000 mg | ORAL_TABLET | Freq: Two times a day (BID) | ORAL | Status: DC
  Administered 2020-10-17 (×2): 2.5 mg via ORAL
  Filled 2020-10-17 (×2): qty 1

## 2020-10-17 NOTE — Progress Notes (Addendum)
PROGRESS NOTE    Chad Bullock  HXT:[id number] DOB: 1941-02-27 DOA: 10/16/2020 PCP: Arlyss Repress, MD   Brief Narrative: Taken from H&P 79 year old male with past medical history of rheumatoid arthritis, atrial flutter (S/P ablation, on eliquis), hypertension, coronary artery disease (S/P CABG), insulin-dependent diabetes mellitus type 2, obesity, prostate cancer who presents to [facility] long hospital medical floor as a transfer from [employer] for COVID-19 infection. His symptoms started a proximately 9 days ago with cough, worsening shortness of breath and generalized malaise.  Multiple sick contacts in household.  Home COVID-19 test was positive.  Initially tried conservative management at home and presented to [facility] with worsening shortness of breath and hypoxia.  Patient is unvaccinated. Started on steroid and remdesivir.  Subjective: Patient was worried about his wife, stating that she is home alone with Covid. His daughter is very scared to come to their house as she is also unvaccinated.  Assessment & Plan:   Principal Problem:   COVID-19 virus infection Active Problems:   Type 2 diabetes mellitus with hyperglycemia, with long-term current use of insulin (HCC)   Rheumatoid arthritis (HCC)   Prolonged QT interval   Essential hypertension   Coronary artery disease without angina pectoris   Atrial flutter (HCC)   Adenocarcinoma of prostate ([place])   Acute respiratory failure with hypoxia (HCC)  Hypoxic respiratory failure secondary to COVID-19. Lactic acidosis resolved.  Procalcitonin remain negative.  CRP at 6.2 and D-dimer of 0.57.  Patient was currently saturating in mid 90s on 2 L. -Continue remdesivir and steroid-day 2 -Continue with supplemental oxygen to keep the saturation above 90%-we will wean as tolerated. -Incentive spirometry and flutter valve. -Continue with supportive care -Continue with supplements which include vitamin C and zinc.  Type 2 diabetes  mellitus with hyperglycemia.  A1c of 7.6. CBG elevated above 200-patient is on steroid. -Increase Lantus to 16 units twice daily. -Continue with SSI  Rheumatoid arthritis.  No acute concern. -Continue home dose of Plaquenil.  Prolonged QT interval.  QTc of 511.  Patient is on Plaquenil. -Keep magnesium above 2 and potassium at 4. -Avoid medications which can further prolong QTC.  Essential hypertension.  Pressure within goal. -Continue home antihypertensives which include HCTZ, lisinopril and metoprolol.  History of CAD.  No chest pain. -Continue beta-blocker and statin  History of atrial flutter.  S/p ablation and 06/2019. Currently in sinus rhythm. -Continue home dose of Eliquis.  Adenocarcinoma of prostate. -Outpatient urology follow-up.  Objective: Vitals:   10/16/20 2058 10/17/20 0044 10/17/20 0525 10/17/20 0848  BP: (!) 137/95 122/67 (!) 149/81 (!) 141/78  Pulse: 83 73 73 72  Resp: 15 15 18 18   Temp: 99.1 F (37.3 C) 98.5 F (36.9 C) 98.6 F (37 C) 98.7 F (37.1 C)  TempSrc: Oral Oral Oral Oral  SpO2: 98% 97% 98% 97%  Weight:      Height:        Intake/Output Summary (Last 24 hours) at 10/17/2020 0857 Last data filed at 10/17/2020 0500 Gross per 24 hour  Intake 107.28 ml  Output --  Net 107.28 ml   Filed Weights   10/16/20 1355  Weight: 102.1 kg    Examination:  General exam: Appears calm and comfortable  Respiratory system: Few scattered rhonchi. Respiratory effort normal. Cardiovascular system: S1 & S2 heard, RRR.  Gastrointestinal system: Soft, nontender, nondistended, bowel sounds positive. Central nervous system: Alert and oriented. No focal neurological deficits.Symmetric 5 x 5 power. Extremities: No edema, no cyanosis, pulses  intact and symmetrical. Skin: No rashes, lesions or ulcers Psychiatry: Judgement and insight appear normal. Mood & affect appropriate.    DVT prophylaxis: Eliquis Code Status: Full Family Communication: Discussed  with patient. Disposition Plan:  Status is: Inpatient  Remains inpatient appropriate because:Inpatient level of care appropriate due to severity of illness   Dispo: The patient is from: Home              Anticipated d/c is to: Home              Anticipated d/c date is: 2 days              Patient currently is not medically stable to d/c.   Consultants:   None  Procedures:  Antimicrobials:   Data Reviewed: I have personally reviewed following labs and imaging studies  CBC: Recent Labs  Lab 10/16/20 1418  WBC 2.5*  NEUTROABS 1.3*  HGB 11.2*  HCT 33.8*  MCV 89.9  PLT 355*   Basic Metabolic Panel: Recent Labs  Lab 10/16/20 1418  NA 137  K 4.0  CL 89*  CO2 35*  GLUCOSE 231*  BUN 26*  CREATININE 1.44*  CALCIUM 8.6*   GFR: Estimated Creatinine Clearance: 49.8 mL/min (A) (by C-G formula based on SCr of 1.44 mg/dL (H)). Liver Function Tests: Recent Labs  Lab 10/16/20 1418  AST 73*  ALT 58*  ALKPHOS 63  BILITOT 0.6  PROT 7.3  ALBUMIN 3.8   No results for input(s): LIPASE, AMYLASE in the last 168 hours. No results for input(s): AMMONIA in the last 168 hours. Coagulation Profile: No results for input(s): INR, PROTIME in the last 168 hours. Cardiac Enzymes: No results for input(s): CKTOTAL, CKMB, CKMBINDEX, TROPONINI in the last 168 hours. BNP (last 3 results) No results for input(s): PROBNP in the last 8760 hours. HbA1C: Recent Labs    10/17/20 0358  HGBA1C 7.6*   CBG: Recent Labs  Lab 10/17/20 0839  GLUCAP 228*   Lipid Profile: Recent Labs    10/16/20 1418  TRIG 200*   Thyroid Function Tests: No results for input(s): TSH, T4TOTAL, FREET4, T3FREE, THYROIDAB in the last 72 hours. Anemia Panel: Recent Labs    10/16/20 1419  FERRITIN 227   Sepsis Labs: Recent Labs  Lab 10/16/20 1418 10/16/20 1607  PROCALCITON <0.10  --   LATICACIDVEN 2.3* 1.9    Recent Results (from the past 240 hour(s))  Blood Culture (routine x 2)     Status:  None (Preliminary result)   Collection Time: 10/16/20  2:15 PM   Specimen: Right Antecubital; Blood  Result Value Ref Range Status   Specimen Description   Final    RIGHT ANTECUBITAL Performed at [facility], [address] [place], [place] [address]    Special Requests   Final    BOTTLES DRAWN AEROBIC AND ANAEROBIC Blood Culture adequate volume Performed at [facility], [address] [place], [place] [address]    Culture   Final    NO GROWTH < 24 HOURS Performed at [facility], [place] [address], [place], [place] [address]    Report Status PENDING  Incomplete  Resp Panel by RT-PCR (Flu A&B, Covid) Nasopharyngeal Swab     Status: Abnormal   Collection Time: 10/16/20  2:19 PM   Specimen: Nasopharyngeal Swab; Nasopharyngeal(NP) swabs in vial transport medium  Result Value Ref Range Status   SARS Coronavirus 2 by RT PCR POSITIVE (A) NEGATIVE Final  Comment: RESULT CALLED TO, READ BACK BY AND VERIFIED WITH: Rosemarie Ax, RN AT [id number] ON [id number] BY BOWLBY, J (NOTE) SARS-CoV-2 target nucleic acids are DETECTED.  The SARS-CoV-2 RNA is generally detectable in upper respiratory specimens during the acute phase of infection. Positive results are indicative of the presence of the identified virus, but do not rule out bacterial infection or co-infection with other pathogens not detected by the test. Clinical correlation with patient history and other diagnostic information is necessary to determine patient infection status. The expected result is Negative.  Fact Sheet for Patients: EntrepreneurPulse.com.au  Fact Sheet for Healthcare Providers: IncredibleEmployment.be  This test is not yet approved or cleared by the [place] FDA and  has been authorized for detection and/or diagnosis of SARS-CoV-2 by FDA under an Emergency Use Authorization (EUA).  This EUA will remain in effect (meaning this  test can be used) for the  duration of  the COVID-19 declaration under Section 564(b)(1) of the Act, 21 U.S.C. section 360bbb-3(b)(1), unless the authorization is terminated or revoked sooner.     Influenza A by PCR NEGATIVE NEGATIVE Final   Influenza B by PCR NEGATIVE NEGATIVE Final    Comment: (NOTE) The Xpert Xpress SARS-CoV-2/FLU/RSV plus assay is intended as an aid in the diagnosis of influenza from Nasopharyngeal swab specimens and should not be used as a sole basis for treatment. Nasal washings and aspirates are unacceptable for Xpert Xpress SARS-CoV-2/FLU/RSV testing.  Fact Sheet for Patients: EntrepreneurPulse.com.au  Fact Sheet for Healthcare Providers: IncredibleEmployment.be  This test is not yet approved or cleared by the [place] FDA and has been authorized for detection and/or diagnosis of SARS-CoV-2 by FDA under an Emergency Use Authorization (EUA). This EUA will remain in effect (meaning this test can be used) for the duration of the COVID-19 declaration under Section 564(b)(1) of the Act, 21 U.S.C. section 360bbb-3(b)(1), unless the authorization is terminated or revoked.  Performed at [facility], [address] [place], [place] [address]   Blood Culture (routine x 2)     Status: None (Preliminary result)   Collection Time: 10/16/20  2:37 PM   Specimen: BLOOD LEFT FOREARM  Result Value Ref Range Status   Specimen Description   Final    BLOOD LEFT FOREARM Performed at [facility], [address] [place], [place] [address]    Special Requests   Final    BOTTLES DRAWN AEROBIC AND ANAEROBIC Blood Culture adequate volume Performed at [facility], [address] [place], [place] [address]    Culture   Final    NO GROWTH < 24 HOURS Performed at [facility], [place] [address] [place], [place] [address]    Report Status PENDING  Incomplete     Radiology Studies: DG Chest Port 1 View  Result Date:  10/16/2020 CLINICAL DATA:  Shortness of breath. EXAM: PORTABLE CHEST 1 VIEW COMPARISON:  None. FINDINGS: The heart size and mediastinal contours are within normal limits. Sternotomy wires are noted. No pneumothorax or pleural effusion is noted. Both lungs are clear. The visualized skeletal structures are unremarkable. IMPRESSION: No active disease. Electronically Signed   By: Marijo Conception M.D.   On: 10/16/2020 14:44    Scheduled Meds: . apixaban  2.5 mg Oral BID  . vitamin C  500 mg Oral Daily  . dexamethasone (DECADRON) injection  6 mg Intravenous Q24H  . diclofenac  75 mg Oral BID  . gabapentin  100  mg Oral TID  . hydrochlorothiazide  12.5 mg Oral Daily  . hydroxychloroquine  400 mg Oral BID  . insulin aspart  0-15 Units Subcutaneous TID AC & HS  . insulin glargine  16 Units Subcutaneous Daily  . lisinopril  20 mg Oral Daily  . metoprolol succinate  50 mg Oral Daily  . simvastatin  20 mg Oral Daily  . zinc sulfate  220 mg Oral Daily   Continuous Infusions: . sodium chloride Stopped (10/16/20 1803)  . lactated ringers 125 mL/hr at 10/17/20 0407  . remdesivir 100 mg in NS 100 mL       LOS: 1 day   Time spent: 35 minutes.  Lorella Nimrod, MD [facility]  If 7PM-7AM, please contact night-coverage [email or web address]  10/17/2020, 8:57 AM   This record has been created using Systems analyst. Errors have been sought and corrected,but may not always be located. Such creation errors do not reflect on the standard of care.

## 2020-10-17 NOTE — H&P (Signed)
History and Physical    Chad Bullock UVO:[id number] DOB: 08-18-1941 DOA: 10/16/2020  PCP: Arlyss Repress, MD  Patient coming from: Home, transferred to [facility] from [facility] ED   Chief Complaint:  Chief Complaint  Patient presents with   Shortness of Breath    Covid +     HPI:   79 year old male with past medical history of rheumatoid arthritis, atrial flutter (S/P ablation, on eliquis), hypertension, coronary artery disease (S/P CABG), insulin-dependent diabetes mellitus type 2, obesity, prostate cancer who presents to [facility] long hospital medical floor as a transfer from [employer] for COVID-19 infection.  Patient explains that approximately 9 days ago to develop a cough.  Cough is dry and nonproductive.  Cough progressed over the next several days and became associated with generalized body aches and weakness.  As patient symptoms persisted patient also developed associated watery diarrhea several times daily.  Patient denies any significant shortness of breath or fever.  Patient does report multiple sick contacts in the household.    Shortly after the patient began to experience symptoms he used a COVID-19 home testing kit and ended up testing positive.  Patient attempted to conservatively manage his COVID-19 infection in the days that followed.  Unfortunately, patient's symptoms progressively worsened over the span of time.  Patient was regularly checking his pulse oximetry at home but noticed that in the 48 hours prior to his presentation his oxygen saturations remained in the 80s.  Patient eventually presented to [place] emergency department for evaluation.  Upon evaluation in the emergency department, patient was found to be saturating at 85% on arrival to the emergency department.  Patient was placed on submental oxygen via nasal cannula to achieve oxygen saturations above 94%.  COVID-19 PCR testing was confirmed to be positive.  Patient was also found to have a  concurrent lactic acidosis.  Patient was initiated on intravenous remdesivir and Decadron.  The hospitalist group was then called and accepted the patient as an incoming transfer to the medical floor at [facility].  Review of Systems:   Review of Systems  Respiratory: Positive for cough.   Neurological: Positive for weakness.  All other systems reviewed and are negative.   Past Medical History:  Diagnosis Date   Adenocarcinoma of prostate ([place]) 10/17/2020   Coronary artery disease without angina pectoris 10/17/2020   Diabetes mellitus without complication ([place])    Enlarged prostate    Essential hypertension 10/17/2020   Hypertension    Rheumatoid arthritis ([place]) 10/17/2020   Type 2 diabetes mellitus with hyperglycemia, with long-term current use of insulin ([place]) 10/17/2020    Past Surgical History:  Procedure Laterality Date   APPENDECTOMY     CHOLECYSTECTOMY     CORONARY ARTERY BYPASS GRAFT     HEMORRHOID SURGERY     PROSTATE SURGERY     TONSILLECTOMY       reports that he has quit smoking. He has never used smokeless tobacco. He reports that he does not drink alcohol. No history on file for drug use.  Allergies  Allergen Reactions   Iodinated Diagnostic Agents Hives and Rash   Amoxicillin-Pot Clavulanate Diarrhea and Nausea And Vomiting    Family History  Problem Relation Age of Onset   Heart disease Neg Hx      Prior to Admission medications   Medication Sig Start Date End Date Taking? Authorizing Provider  apixaban (ELIQUIS) 2.5 MG TABS tablet Take 2.5 mg by mouth 2 (two) times daily.  Yes [provider]  diclofenac (VOLTAREN) 75 MG EC tablet Take 75 mg by mouth 2 (two) times daily.   Yes [provider]  Dulaglutide (TRULICITY) 1.5 NI/7.7OE SOPN Inject 1.5 mg into the skin once a week. wednesday   Yes [provider]  gabapentin (NEURONTIN) 100 MG capsule Take 100 mg by mouth 3 (three) times daily.   Yes  [provider]  glipiZIDE (GLUCOTROL) 5 MG tablet Take 5 mg by mouth 2 (two) times daily before a meal.   Yes [provider]  hydrochlorothiazide (MICROZIDE) 12.5 MG capsule Take 12.5 mg by mouth daily.   Yes [provider]  hydroxychloroquine (PLAQUENIL) 200 MG tablet Take 400 mg by mouth 2 (two) times daily.   Yes [provider]  Insulin Glargine (TOUJEO MAX SOLOSTAR [place]) Inject 16 Units into the skin daily.   Yes [provider]  lisinopril (ZESTRIL) 20 MG tablet Take 20 mg by mouth daily.   Yes [provider]  metFORMIN (GLUCOPHAGE-XR) 500 MG 24 hr tablet Take 500 mg by mouth daily with breakfast.   Yes [provider]  metoprolol succinate (TOPROL-XL) 50 MG 24 hr tablet Take 50 mg by mouth daily. Take with or immediately following a meal.   Yes [provider]  simvastatin (ZOCOR) 20 MG tablet Take 20 mg by mouth daily.   Yes [provider]  benzonatate (TESSALON) 100 MG capsule Take 1 capsule (100 mg total) by mouth every 8 (eight) hours. 03/29/16   Isla Pence, MD  chlorpheniramine-HYDROcodone (TUSSIONEX PENNKINETIC ER) 10-8 MG/5ML SUER Take 5 mLs by mouth 2 (two) times daily. 03/29/16   Isla Pence, MD    Physical Exam: Vitals:   10/16/20 2000 10/16/20 2012 10/16/20 2058 10/17/20 0044  BP: (!) 142/60 (!) 142/60 (!) 137/95 122/67  Pulse: 83 82 83 73  Resp: 20 20 15 15   Temp:  99 F (37.2 C) 99.1 F (37.3 C) 98.5 F (36.9 C)  TempSrc:   Oral Oral  SpO2: 97% 99% 98% 97%  Weight:      Height:        Constitutional: Acute alert and oriented x3, no associated distress.   Skin: no rashes, no lesions, good skin turgor noted. Eyes: Pupils are equally reactive to light.  No evidence of scleral icterus or conjunctival pallor.  ENMT: Moist mucous membranes noted.  Posterior pharynx clear of any exudate or lesions.   Neck: normal, supple, no masses, no thyromegaly.  No evidence of jugular venous  distension.   Respiratory: Notable bibasilar rales without evidence of wheezing, no crackles. Normal respiratory effort. No accessory muscle use.  Cardiovascular: Regular rate and rhythm, no murmurs / rubs / gallops. No extremity edema. 2+ pedal pulses. No carotid bruits.  Chest:   Nontender without crepitus or deformity.   Back:   Nontender without crepitus or deformity. Abdomen: Abdomen is soft and nontender.  No evidence of intra-abdominal masses.  Positive bowel sounds noted in all quadrants.   Musculoskeletal: No joint deformity upper and lower extremities. Good ROM, no contractures. Normal muscle tone.  Neurologic: CN 2-12 grossly intact. Sensation intact.  Patient moving all 4 extremities spontaneously.  Patient is following all commands.  Patient is responsive to verbal stimuli.   Psychiatric: Patient exhibits normal mood with appropriate affect.  Patient seems to possess insight as to their current situation.     Labs on Admission: I have personally reviewed following labs and imaging studies -   CBC: Recent Labs  Lab 10/16/20 1418  WBC 2.5*  NEUTROABS 1.3*  HGB 11.2*  HCT 33.8*  MCV 89.9  PLT 951*   Basic Metabolic Panel: Recent Labs  Lab 10/16/20 1418  NA 137  K 4.0  CL 89*  CO2 35*  GLUCOSE 231*  BUN 26*  CREATININE 1.44*  CALCIUM 8.6*   GFR: Estimated Creatinine Clearance: 49.8 mL/min (A) (by C-G formula based on SCr of 1.44 mg/dL (H)). Liver Function Tests: Recent Labs  Lab 10/16/20 1418  AST 73*  ALT 58*  ALKPHOS 63  BILITOT 0.6  PROT 7.3  ALBUMIN 3.8   No results for input(s): LIPASE, AMYLASE in the last 168 hours. No results for input(s): AMMONIA in the last 168 hours. Coagulation Profile: No results for input(s): INR, PROTIME in the last 168 hours. Cardiac Enzymes: No results for input(s): CKTOTAL, CKMB, CKMBINDEX, TROPONINI in the last 168 hours. BNP (last 3 results) No results for input(s): PROBNP in the last 8760 hours. HbA1C: No results  for input(s): HGBA1C in the last 72 hours. CBG: No results for input(s): GLUCAP in the last 168 hours. Lipid Profile: Recent Labs    10/16/20 1418  TRIG 200*   Thyroid Function Tests: No results for input(s): TSH, T4TOTAL, FREET4, T3FREE, THYROIDAB in the last 72 hours. Anemia Panel: Recent Labs    10/16/20 1419  FERRITIN 227   Urine analysis:    Component Value Date/Time   COLORURINE YELLOW 03/29/2016 [place] 03/29/2016 1415   LABSPEC 1.018 03/29/2016 1415   PHURINE 5.0 03/29/2016 1415   GLUCOSEU NEGATIVE 03/29/2016 1415   HGBUR NEGATIVE 03/29/2016 [place] 03/29/2016 [place] 03/29/2016 1415   PROTEINUR NEGATIVE 03/29/2016 1415   NITRITE NEGATIVE 03/29/2016 1415   LEUKOCYTESUR MODERATE (A) 03/29/2016 1415    Radiological Exams on Admission - Personally Reviewed: DG Chest Port 1 View  Result Date: 10/16/2020 CLINICAL DATA:  Shortness of breath. EXAM: PORTABLE CHEST 1 VIEW COMPARISON:  None. FINDINGS: The heart size and mediastinal contours are within normal limits. Sternotomy wires are noted. No pneumothorax or pleural effusion is noted. Both lungs are clear. The visualized skeletal structures are unremarkable. IMPRESSION: No active disease. Electronically Signed   By: Marijo Conception M.D.   On: 10/16/2020 14:44    EKG: Personally reviewed.  Rhythm is normal sinus rhythm with heart rate of 85 bpm.  Events of intraventricular conduction delay.  Evidence of prolonged QTc interval of 501 ms no dynamic ST segment changes appreciated.  Assessment/Plan Principal Problem:   COVID-19 virus infection   Patient presenting with over a 1 week history of cough generalized malaise and diarrhea.  Patient found to have concurrent hypoxia on arrival requiring supplemental oxygen to achieve oxygen saturations above 94%.  COVID-19 PCR testing upon arrival to [place] emergency department found to be  positive.  Patient has been initiated on intravenous remdesivir and intravenous dexamethasone  As needed bronchodilator therapy  As needed antitussives  Daily zinc and vitamin C sedimentation  hydration with intravenous isotonic fluids  Patient has been admitted to the COVID-19 specialty unit  Active Problems:   Acute respiratory failure with hypoxia ([place])   Thought to be secondary to COVID-19 infection  Please see remainder of assessment and plan above.    Type 2 diabetes mellitus with hyperglycemia, with long-term current use of insulin (HCC)   Accu-Cheks before every meal and nightly with signs scale insulin  Resuming home regimen of basal  insulin therapy  Hemoglobin A1c pending    Rheumatoid arthritis ([place])   Continuing home regimen of Plaquenil  Patient may benefit from reduction in Plaquenil dosing considering prolonged QT interval    Prolonged QT interval   Notable prolonged QT on initial EKG with QTC of 511  Monitoring patient on telemetry  Monitoring magnesium and potassium levels with serial chemistries and will correct as necessary  Attempting to avoid addition of significantly QT prolonging agents    Essential hypertension   Continue home regimen of antihypertensive therapy    Coronary artery disease without angina pectoris   Patient is currently chest pain free  Monitoring patient on telemetry  Continue home regimen of antiplatelet therapy, lipid lowering therapy and AV nodal blocking therapy    Atrial flutter (HCC)   Currently rate controlled.  Patient normal sinus rhythm since ablation in 06/2019  Continue home regimen of anticoagulation  Continue home regimen of rate controlling agent  Monitoring on telemetry    Adenocarcinoma of prostate [facility])    Outpatient urology follow-up   Code Status:  Full code Family Communication: deferred   Status is: Inpatient  Remains inpatient appropriate because:IV treatments  appropriate due to intensity of illness or inability to take PO and Inpatient level of care appropriate due to severity of illness   Dispo: The patient is from: Home              Anticipated d/c is to: Home              Anticipated d/c date is: 3 days              Patient currently is not medically stable to d/c.        Vernelle Emerald MD [facility] Pager [phone]  If 7PM-7AM, please contact night-coverage [email or web address] Use universal [place] password for that web site. If you do not have the password, please call the hospital operator.  10/17/2020, 2:56 AM

## 2020-10-18 LAB — CBC WITH DIFFERENTIAL/PLATELET
Abs Immature Granulocytes: 0.04 10*3/uL (ref 0.00–0.07)
Basophils Absolute: 0 10*3/uL (ref 0.0–0.1)
Basophils Relative: 1 %
Eosinophils Absolute: 0 10*3/uL (ref 0.0–0.5)
Eosinophils Relative: 0 %
HCT: 34.1 % — ABNORMAL LOW (ref 39.0–52.0)
Hemoglobin: 10.8 g/dL — ABNORMAL LOW (ref 13.0–17.0)
Immature Granulocytes: 1 %
Lymphocytes Relative: 12 %
Lymphs Abs: 0.5 10*3/uL — ABNORMAL LOW (ref 0.7–4.0)
MCH: 29.3 pg (ref 26.0–34.0)
MCHC: 31.7 g/dL (ref 30.0–36.0)
MCV: 92.7 fL (ref 80.0–100.0)
Monocytes Absolute: 0.1 10*3/uL (ref 0.1–1.0)
Monocytes Relative: 3 %
Neutro Abs: 3.6 10*3/uL (ref 1.7–7.7)
Neutrophils Relative %: 83 %
Platelets: 177 10*3/uL (ref 150–400)
RBC: 3.68 MIL/uL — ABNORMAL LOW (ref 4.22–5.81)
RDW: 15.3 % (ref 11.5–15.5)
WBC: 4.3 10*3/uL (ref 4.0–10.5)
nRBC: 0 % (ref 0.0–0.2)

## 2020-10-18 LAB — COMPREHENSIVE METABOLIC PANEL
ALT: 53 U/L — ABNORMAL HIGH (ref 0–44)
AST: 57 U/L — ABNORMAL HIGH (ref 15–41)
Albumin: 3.4 g/dL — ABNORMAL LOW (ref 3.5–5.0)
Alkaline Phosphatase: 58 U/L (ref 38–126)
Anion gap: 12 (ref 5–15)
BUN: 35 mg/dL — ABNORMAL HIGH (ref 8–23)
CO2: 32 mmol/L (ref 22–32)
Calcium: 8.4 mg/dL — ABNORMAL LOW (ref 8.9–10.3)
Chloride: 93 mmol/L — ABNORMAL LOW (ref 98–111)
Creatinine, Ser: 1.42 mg/dL — ABNORMAL HIGH (ref 0.61–1.24)
GFR, Estimated: 51 mL/min — ABNORMAL LOW (ref >60)
Glucose, Bld: 318 mg/dL — ABNORMAL HIGH (ref 70–99)
Potassium: 4.5 mmol/L (ref 3.5–5.1)
Sodium: 137 mmol/L (ref 135–145)
Total Bilirubin: 0.2 mg/dL — ABNORMAL LOW (ref 0.3–1.2)
Total Protein: 7 g/dL (ref 6.5–8.1)

## 2020-10-18 LAB — C-REACTIVE PROTEIN: CRP: 3.6 mg/dL — ABNORMAL HIGH (ref <1.0)

## 2020-10-18 LAB — D-DIMER, QUANTITATIVE: D-Dimer, Quant: <0.27 ug/mL-FEU (ref 0.00–0.50)

## 2020-10-18 LAB — GLUCOSE, CAPILLARY
Glucose-Capillary: 254 mg/dL — ABNORMAL HIGH (ref 70–99)
Glucose-Capillary: 310 mg/dL — ABNORMAL HIGH (ref 70–99)
Glucose-Capillary: 256 mg/dL — ABNORMAL HIGH (ref 70–99)
Glucose-Capillary: 190 mg/dL — ABNORMAL HIGH (ref 70–99)

## 2020-10-18 LAB — MAGNESIUM: Magnesium: 2.3 mg/dL (ref 1.7–2.4)

## 2020-10-18 MED ORDER — INSULIN ASPART 100 UNIT/ML ~~LOC~~ SOLN
0.0000 [IU] | Freq: Three times a day (TID) | SUBCUTANEOUS | Status: DC
  Administered 2020-10-18 – 2020-10-19 (×2): 15 [IU] via SUBCUTANEOUS
  Administered 2020-10-19: 12:00:00 3 [IU] via SUBCUTANEOUS
  Administered 2020-10-20: 09:00:00 11 [IU] via SUBCUTANEOUS
  Administered 2020-10-20: 11:00:00 20 [IU] via SUBCUTANEOUS

## 2020-10-18 MED ORDER — INSULIN ASPART 100 UNIT/ML ~~LOC~~ SOLN
4.0000 [IU] | Freq: Three times a day (TID) | SUBCUTANEOUS | Status: DC
  Administered 2020-10-18 – 2020-10-20 (×6): 4 [IU] via SUBCUTANEOUS

## 2020-10-18 NOTE — Progress Notes (Signed)
PROGRESS NOTE    Chad Bullock  IWL:[id number] DOB: 06-09-1941 DOA: 10/16/2020 PCP: Arlyss Repress, MD   Brief Narrative: Taken from H&P 79 year old male with past medical history of rheumatoid arthritis, atrial flutter (S/P ablation, on eliquis), hypertension, coronary artery disease (S/P CABG), insulin-dependent diabetes mellitus type 2, obesity, prostate cancer who presents to [facility] long hospital medical floor as a transfer from [employer] for COVID-19 infection. His symptoms started a proximately 9 days ago with cough, worsening shortness of breath and generalized malaise.  Multiple sick contacts in household.  Home COVID-19 test was positive.  Initially tried conservative management at home and presented to [facility] with worsening shortness of breath and hypoxia.  Patient is unvaccinated. Started on steroid and remdesivir.  Subjective: Patient was feeling better when seen today.  Initially wants to go home, arrangement made for outpatient remdesivir, apparently no outpatient clinic on Sunday, patient now wants to stay in the hospital to complete his treatment.  Assessment & Plan:   Principal Problem:   COVID-19 virus infection Active Problems:   Type 2 diabetes mellitus with hyperglycemia, with long-term current use of insulin (HCC)   Rheumatoid arthritis (HCC)   Prolonged QT interval   Essential hypertension   Coronary artery disease without angina pectoris   Atrial flutter (HCC)   Adenocarcinoma of prostate ([place])   Acute respiratory failure with hypoxia (HCC)  Hypoxic respiratory failure secondary to COVID-19. Lactic acidosis resolved.  Procalcitonin remain negative.  CRP at 3.6 and D-dimer of <0.27.  Patient was saturating in mid 90s on room air while resting. -Continue remdesivir and steroid-day 3 -Continue with supplemental oxygen to keep the saturation above 90% if needed. -Incentive spirometry and flutter valve. -Continue with supportive care -Continue with  supplements which include vitamin C and zinc.  Type 2 diabetes mellitus with hyperglycemia.  A1c of 7.6. CBG elevated above 200-patient is on steroid. -Continue Lantus to 16 units twice daily. -Switch moderate sliding scale with resistant. -Add 4 units of NovoLog with meals.  Rheumatoid arthritis.  No acute concern. -Continue home dose of Plaquenil.  Prolonged QT interval.  QTc of 511.  Patient is on Plaquenil. -Keep magnesium above 2 and potassium at 4. -Avoid medications which can further prolong QTC.  Essential hypertension.  Pressure within goal. -Continue home antihypertensives which include HCTZ, lisinopril and metoprolol.  History of CAD.  No chest pain. -Continue beta-blocker and statin  History of atrial flutter.  S/p ablation and 06/2019. Currently in sinus rhythm. -Continue home dose of Eliquis.  Adenocarcinoma of prostate. -Outpatient urology follow-up.  Objective: Vitals:   10/17/20 0848 10/17/20 1506 10/17/20 2053 10/18/20 0420  BP: (!) 141/78 130/74 109/72 127/82  Pulse: 72 74 63 (!) 59  Resp: 18 19 17 15   Temp: 98.7 F (37.1 C) 98.5 F (36.9 C) 98.7 F (37.1 C) 97.9 F (36.6 C)  TempSrc: Oral Oral Oral Oral  SpO2: 97% 97% 92% 97%  Weight:      Height:        Intake/Output Summary (Last 24 hours) at 10/18/2020 1330 Last data filed at 10/18/2020 0300 Gross per 24 hour  Intake 202.83 ml  Output --  Net 202.83 ml   Filed Weights   10/16/20 1355  Weight: 102.1 kg    Examination:  General.  Developed gentleman, in no acute distress. Pulmonary.  Lungs clear bilaterally, normal respiratory effort. CV.  Regular rate and rhythm, no JVD, rub or murmur. Abdomen.  Soft, nontender, nondistended, BS positive. CNS.  Alert and oriented x3.  No focal neurologic deficit. Extremities.  No edema, no cyanosis, pulses intact and symmetrical. Psychiatry.  Judgment and insight appears normal.   DVT prophylaxis: Eliquis Code Status: Full Family  Communication: Discussed with patient. Disposition Plan:  Status is: Inpatient  Remains inpatient appropriate because:Inpatient level of care appropriate due to severity of illness   Dispo: The patient is from: Home              Anticipated d/c is to: Home              Anticipated d/c date is: 2 days              Patient currently is not medically stable to d/c.  Patient initially wants to go home today, outpatient remdesivir clinic is not open on Sunday, no he will rather complete his treatment while in the hospital.  Consultants:   None  Procedures:  Antimicrobials:   Data Reviewed: I have personally reviewed following labs and imaging studies  CBC: Recent Labs  Lab 10/16/20 1418 10/18/20 0319  WBC 2.5* 4.3  NEUTROABS 1.3* 3.6  HGB 11.2* 10.8*  HCT 33.8* 34.1*  MCV 89.9 92.7  PLT 140* 177   Basic Metabolic Panel: Recent Labs  Lab 10/16/20 1418 10/18/20 0319  NA 137 137  K 4.0 4.5  CL 89* 93*  CO2 35* 32  GLUCOSE 231* 318*  BUN 26* 35*  CREATININE 1.44* 1.42*  CALCIUM 8.6* 8.4*  MG  --  2.3   GFR: Estimated Creatinine Clearance: 50.5 mL/min (A) (by C-G formula based on SCr of 1.42 mg/dL (H)). Liver Function Tests: Recent Labs  Lab 10/16/20 1418 10/18/20 0319  AST 73* 57*  ALT 58* 53*  ALKPHOS 63 58  BILITOT 0.6 0.2*  PROT 7.3 7.0  ALBUMIN 3.8 3.4*   No results for input(s): LIPASE, AMYLASE in the last 168 hours. No results for input(s): AMMONIA in the last 168 hours. Coagulation Profile: No results for input(s): INR, PROTIME in the last 168 hours. Cardiac Enzymes: No results for input(s): CKTOTAL, CKMB, CKMBINDEX, TROPONINI in the last 168 hours. BNP (last 3 results) No results for input(s): PROBNP in the last 8760 hours. HbA1C: Recent Labs    10/17/20 0358  HGBA1C 7.6*   CBG: Recent Labs  Lab 10/17/20 1109 10/17/20 1626 10/17/20 2055 10/18/20 0725 10/18/20 1139  GLUCAP 241* 235* 253* 254* 310*   Lipid Profile: Recent Labs     12 /16/21 1418  TRIG 200*   Thyroid Function Tests: No results for input(s): TSH, T4TOTAL, FREET4, T3FREE, THYROIDAB in the last 72 hours. Anemia Panel: Recent Labs    10/16/20 1419  FERRITIN 227   Sepsis Labs: Recent Labs  Lab 10/16/20 1418 10/16/20 1607  PROCALCITON <0.10  --   LATICACIDVEN 2.3* 1.9    Recent Results (from the past 240 hour(s))  Blood Culture (routine x 2)     Status: None (Preliminary result)   Collection Time: 10/16/20  2:15 PM   Specimen: Right Antecubital; Blood  Result Value Ref Range Status   Specimen Description   Final    RIGHT ANTECUBITAL Performed at [facility], [address] [place], [place] [address]    Special Requests   Final    BOTTLES DRAWN AEROBIC AND ANAEROBIC Blood Culture adequate volume Performed at [facility], [address] [place], [place] [address]    Culture   Final    NO GROWTH < 24  HOURS Performed at [facility], [place] [address], [place], [place] [address]    Report Status PENDING  Incomplete  Resp Panel by RT-PCR (Flu A&B, Covid) Nasopharyngeal Swab     Status: Abnormal   Collection Time: 10/16/20  2:19 PM   Specimen: Nasopharyngeal Swab; Nasopharyngeal(NP) swabs in vial transport medium  Result Value Ref Range Status   SARS Coronavirus 2 by RT PCR POSITIVE (A) NEGATIVE Final    Comment: RESULT CALLED TO, READ BACK BY AND VERIFIED WITH: Rosemarie Ax, RN AT 0370 ON [id number] BY BOWLBY, J (NOTE) SARS-CoV-2 target nucleic acids are DETECTED.  The SARS-CoV-2 RNA is generally detectable in upper respiratory specimens during the acute phase of infection. Positive results are indicative of the presence of the identified virus, but do not rule out bacterial infection or co-infection with other pathogens not detected by the test. Clinical correlation with patient history and other diagnostic information is necessary to determine patient infection status. The expected result is Negative.  Fact  Sheet for Patients: EntrepreneurPulse.com.au  Fact Sheet for Healthcare Providers: IncredibleEmployment.be  This test is not yet approved or cleared by the [place] FDA and  has been authorized for detection and/or diagnosis of SARS-CoV-2 by FDA under an Emergency Use Authorization (EUA).  This EUA will remain in effect (meaning this  test can be used) for the duration of  the COVID-19 declaration under Section 564(b)(1) of the Act, 21 U.S.C. section 360bbb-3(b)(1), unless the authorization is terminated or revoked sooner.     Influenza A by PCR NEGATIVE NEGATIVE Final   Influenza B by PCR NEGATIVE NEGATIVE Final    Comment: (NOTE) The Xpert Xpress SARS-CoV-2/FLU/RSV plus assay is intended as an aid in the diagnosis of influenza from Nasopharyngeal swab specimens and should not be used as a sole basis for treatment. Nasal washings and aspirates are unacceptable for Xpert Xpress SARS-CoV-2/FLU/RSV testing.  Fact Sheet for Patients: EntrepreneurPulse.com.au  Fact Sheet for Healthcare Providers: IncredibleEmployment.be  This test is not yet approved or cleared by the [place] FDA and has been authorized for detection and/or diagnosis of SARS-CoV-2 by FDA under an Emergency Use Authorization (EUA). This EUA will remain in effect (meaning this test can be used) for the duration of the COVID-19 declaration under Section 564(b)(1) of the Act, 21 U.S.C. section 360bbb-3(b)(1), unless the authorization is terminated or revoked.  Performed at [facility], [address] [place], [place] [address]   Blood Culture (routine x 2)     Status: None (Preliminary result)   Collection Time: 10/16/20  2:37 PM   Specimen: BLOOD LEFT FOREARM  Result Value Ref Range Status   Specimen Description   Final    BLOOD LEFT FOREARM Performed at [facility], [address] [place], [place]  [address]    Special Requests   Final    BOTTLES DRAWN AEROBIC AND ANAEROBIC Blood Culture adequate volume Performed at [facility], [address] [place], [place] [address]    Culture   Final    NO GROWTH < 24 HOURS Performed at [facility], [place] [address], [place], [place] [address]    Report Status PENDING  Incomplete     Radiology Studies: DG Chest Port 1 View  Result Date: 10/16/2020 CLINICAL DATA:  Shortness of breath. EXAM: PORTABLE CHEST 1 VIEW COMPARISON:  None. FINDINGS: The heart size and mediastinal contours are within normal limits. Sternotomy wires are noted. No pneumothorax  or pleural effusion is noted. Both lungs are clear. The visualized skeletal structures are unremarkable. IMPRESSION: No active disease. Electronically Signed   By: Marijo Conception M.D.   On: 10/16/2020 14:44    Scheduled Meds: . apixaban  5 mg Oral BID  . vitamin C  500 mg Oral Daily  . dexamethasone (DECADRON) injection  6 mg Intravenous Q24H  . diclofenac  75 mg Oral BID  . gabapentin  100 mg Oral TID  . hydrochlorothiazide  12.5 mg Oral Daily  . hydroxychloroquine  200 mg Oral BID  . insulin aspart  0-15 Units Subcutaneous TID AC & HS  . insulin glargine  16 Units Subcutaneous BID  . lisinopril  20 mg Oral Daily  . metoprolol succinate  50 mg Oral Daily  . simvastatin  20 mg Oral Daily  . zinc sulfate  220 mg Oral Daily   Continuous Infusions: . sodium chloride Stopped (10/17/20 1928)  . remdesivir 100 mg in NS 100 mL 100 mg (10/18/20 0930)     LOS: 2 days   Time spent: 30 minutes.  Lorella Nimrod, MD [facility]  If 7PM-7AM, please contact night-coverage [email or web address]  10/18/2020, 1:30 PM   This record has been created using Systems analyst. Errors have been sought and corrected,but may not always be located. Such creation errors do not reflect on the standard of care.

## 2020-10-18 NOTE — Plan of Care (Signed)
Alert and oriented. Denies any pain. O2 at 0.5L well tolerated when awake. O2 increased to 1L at bedtime  due to desaturation. Call bell within reach  Problem: Education: Goal: Knowledge of General Education information will improve Description: Including pain rating scale, medication(s)/side effects and non-pharmacologic comfort measures Outcome: Progressing   Problem: Health Behavior/Discharge Planning: Goal: Ability to manage health-related needs will improve Outcome: Progressing   Problem: Clinical Measurements: Goal: Ability to maintain clinical measurements within normal limits will improve Outcome: Progressing Goal: Will remain free from infection Outcome: Progressing Goal: Diagnostic test results will improve Outcome: Progressing Goal: Respiratory complications will improve Outcome: Progressing Goal: Cardiovascular complication will be avoided Outcome: Progressing   Problem: Activity: Goal: Risk for activity intolerance will decrease Outcome: Progressing   Problem: Nutrition: Goal: Adequate nutrition will be maintained Outcome: Progressing   Problem: Coping: Goal: Level of anxiety will decrease Outcome: Progressing   Problem: Elimination: Goal: Will not experience complications related to bowel motility Outcome: Progressing Goal: Will not experience complications related to urinary retention Outcome: Progressing   Problem: Pain Managment: Goal: General experience of comfort will improve Outcome: Progressing   Problem: Safety: Goal: Ability to remain free from injury will improve Outcome: Progressing   Problem: Skin Integrity: Goal: Risk for impaired skin integrity will decrease Outcome: Progressing   Problem: Education: Goal: Knowledge of risk factors and measures for prevention of condition will improve Outcome: Progressing   Problem: Coping: Goal: Psychosocial and spiritual needs will be supported Outcome: Progressing   Problem:  Respiratory: Goal: Will maintain a patent airway Outcome: Progressing Goal: Complications related to the disease process, condition or treatment will be avoided or minimized Outcome: Progressing

## 2020-10-18 NOTE — Progress Notes (Addendum)
Patient scheduled for outpatient Remdesivir infusions at 12pm on Monday 12/20 and Tuesday 12/21 at [facility]. Please inform the patient to park at [place], as staff will be escorting the patient through the [place] entrance of the hospital. Appointments take approximately 45 minutes.    There is a wave flag banner located near the entrance on [address]. Turn into this entrance and immediately turn left or right and park in 1 of the 10 designated Covid Infusion Parking spots. There is a phone number on the sign, please call and let the staff know what spot you are in and we will come out and get you. For questions call [phone].  Thanks.    33 Was contacted by MD, pt is now staying in the hospital, will cancel appointments

## 2020-10-19 ENCOUNTER — Inpatient Hospital Stay (HOSPITAL_COMMUNITY): Payer: Medicare HMO

## 2020-10-19 LAB — COMPREHENSIVE METABOLIC PANEL
ALT: 46 U/L — ABNORMAL HIGH (ref 0–44)
AST: 47 U/L — ABNORMAL HIGH (ref 15–41)
Albumin: 3.2 g/dL — ABNORMAL LOW (ref 3.5–5.0)
Alkaline Phosphatase: 50 U/L (ref 38–126)
Anion gap: 12 (ref 5–15)
BUN: 37 mg/dL — ABNORMAL HIGH (ref 8–23)
CO2: 33 mmol/L — ABNORMAL HIGH (ref 22–32)
Calcium: 8.3 mg/dL — ABNORMAL LOW (ref 8.9–10.3)
Chloride: 94 mmol/L — ABNORMAL LOW (ref 98–111)
Creatinine, Ser: 1.27 mg/dL — ABNORMAL HIGH (ref 0.61–1.24)
GFR, Estimated: 58 mL/min — ABNORMAL LOW (ref >60)
Glucose, Bld: 147 mg/dL — ABNORMAL HIGH (ref 70–99)
Potassium: 3.7 mmol/L (ref 3.5–5.1)
Sodium: 139 mmol/L (ref 135–145)
Total Bilirubin: 0.3 mg/dL (ref 0.3–1.2)
Total Protein: 6.4 g/dL — ABNORMAL LOW (ref 6.5–8.1)

## 2020-10-19 LAB — CBC WITH DIFFERENTIAL/PLATELET
Abs Immature Granulocytes: 0.09 10*3/uL — ABNORMAL HIGH (ref 0.00–0.07)
Basophils Absolute: 0 10*3/uL (ref 0.0–0.1)
Basophils Relative: 0 %
Eosinophils Absolute: 0 10*3/uL (ref 0.0–0.5)
Eosinophils Relative: 0 %
HCT: 33.8 % — ABNORMAL LOW (ref 39.0–52.0)
Hemoglobin: 10.7 g/dL — ABNORMAL LOW (ref 13.0–17.0)
Immature Granulocytes: 2 %
Lymphocytes Relative: 16 %
Lymphs Abs: 0.8 10*3/uL (ref 0.7–4.0)
MCH: 29.2 pg (ref 26.0–34.0)
MCHC: 31.7 g/dL (ref 30.0–36.0)
MCV: 92.1 fL (ref 80.0–100.0)
Monocytes Absolute: 0.3 10*3/uL (ref 0.1–1.0)
Monocytes Relative: 7 %
Neutro Abs: 3.6 10*3/uL (ref 1.7–7.7)
Neutrophils Relative %: 75 %
Platelets: 170 10*3/uL (ref 150–400)
RBC: 3.67 MIL/uL — ABNORMAL LOW (ref 4.22–5.81)
RDW: 15.1 % (ref 11.5–15.5)
WBC: 4.8 10*3/uL (ref 4.0–10.5)
nRBC: 0 % (ref 0.0–0.2)

## 2020-10-19 LAB — D-DIMER, QUANTITATIVE: D-Dimer, Quant: 0.28 ug/mL-FEU (ref 0.00–0.50)

## 2020-10-19 LAB — GLUCOSE, CAPILLARY
Glucose-Capillary: 111 mg/dL — ABNORMAL HIGH (ref 70–99)
Glucose-Capillary: 140 mg/dL — ABNORMAL HIGH (ref 70–99)
Glucose-Capillary: 316 mg/dL — ABNORMAL HIGH (ref 70–99)
Glucose-Capillary: 217 mg/dL — ABNORMAL HIGH (ref 70–99)

## 2020-10-19 LAB — C-REACTIVE PROTEIN: CRP: 1.5 mg/dL — ABNORMAL HIGH (ref <1.0)

## 2020-10-19 LAB — MAGNESIUM: Magnesium: 2 mg/dL (ref 1.7–2.4)

## 2020-10-19 MED ORDER — PREDNISONE 20 MG PO TABS: 50.0000 mg | ORAL_TABLET | Freq: Every day | ORAL | Status: DC

## 2020-10-19 MED ORDER — METHYLPREDNISOLONE SODIUM SUCC 125 MG IJ SOLR
1.0000 mg/kg | Freq: Two times a day (BID) | INTRAMUSCULAR | Status: DC
  Administered 2020-10-19 – 2020-10-20 (×3): 101.875 mg via INTRAVENOUS
  Filled 2020-10-19 (×3): qty 2

## 2020-10-19 MED ORDER — POTASSIUM CHLORIDE CRYS ER 20 MEQ PO TBCR
40.0000 meq | EXTENDED_RELEASE_TABLET | Freq: Every day | ORAL | Status: DC
  Administered 2020-10-19 – 2020-10-20 (×2): 40 meq via ORAL
  Filled 2020-10-19: qty 2
  Filled 2020-10-19: qty 4

## 2020-10-19 NOTE — Plan of Care (Signed)
  Problem: Education: Goal: Knowledge of General Education information will improve Description: Including pain rating scale, medication(s)/side effects and non-pharmacologic comfort measures Outcome: Progressing   Problem: Health Behavior/Discharge Planning: Goal: Ability to manage health-related needs will improve Outcome: Progressing   Problem: Clinical Measurements: Goal: Ability to maintain clinical measurements within normal limits will improve Outcome: Progressing Goal: Will remain free from infection Outcome: Progressing Goal: Diagnostic test results will improve Outcome: Progressing Goal: Respiratory complications will improve Outcome: Progressing Goal: Cardiovascular complication will be avoided Outcome: Progressing   Problem: Clinical Measurements: Goal: Respiratory complications will improve Outcome: Progressing   Problem: Clinical Measurements: Goal: Diagnostic test results will improve Outcome: Progressing   Problem: Activity: Goal: Risk for activity intolerance will decrease Outcome: Progressing   Problem: Coping: Goal: Level of anxiety will decrease Outcome: Progressing   Problem: Safety: Goal: Ability to remain free from injury will improve Outcome: Progressing   Problem: Education: Goal: Knowledge of risk factors and measures for prevention of condition will improve Outcome: Progressing   Problem: Respiratory: Goal: Will maintain a patent airway Outcome: Progressing Goal: Complications related to the disease process, condition or treatment will be avoided or minimized Outcome: Progressing

## 2020-10-19 NOTE — Progress Notes (Signed)
PROGRESS NOTE  Chad Bullock YDX:[id number] DOB: 1941-09-19 DOA: 10/16/2020 PCP: Arlyss Repress, MD  HPI/Recap of past 92 hours: 79 year old male with past medical history of rheumatoid arthritis, atrial flutter(S/P ablation, on eliquis),hypertension, coronary artery disease(S/P CABG),insulin-dependent diabetes mellitus type 2, obesity, prostate cancer who presents to [facility] long hospital medical floor as a transfer from [facility] for COVID-19 infection. His symptoms started a proximately 9 days ago with cough, worsening shortness of breath and generalized malaise.  Multiple sick contacts in household.  Home COVID-19 test was positive.  Initially tried conservative management at home and presented to [facility] with worsening shortness of breath and hypoxia.  Patient is unvaccinated. Started on steroid and remdesivir.  10/19/20: Seen and examined.  States he does not feel well.  Reports generalized fatigue and dyspnea with exertion.  Received dose on remdesivir today.  Assessment/Plan: Principal Problem:   COVID-19 virus infection Active Problems:   Type 2 diabetes mellitus with hyperglycemia, with long-term current use of insulin (HCC)   Rheumatoid arthritis (HCC)   Prolonged QT interval   Essential hypertension   Coronary artery disease without angina pectoris   Atrial flutter (HCC)   Adenocarcinoma of prostate (HCC)   Acute respiratory failure with hypoxia (HCC)  Acute hypoxic respiratory failure secondary to COVID-19 viral infection and atelectasis No clear evidence of COVID-19 viral pneumonia on chest x-ray Not on oxygen supplementation at baseline currently requiring 2 L to maintain O2 saturation greater than 90%  Bronchodilators Switch to IV steroids to Solu-Medrol twice daily Complete 5 days of remdesivir Maintain O2 saturation greater than 92%. Wean off oxygen supplementation as tolerated Instructed to use incentive spirometer to avoid atelectasis. Independently  reviewed chest x-ray done on 10/19/2020, no lobular infiltrates.  Atelectasis noted on left lower lobe. Encouraged to use incentive spirometer Obtain home oxygen evaluation on 10/19/20 for DC planning.  COVID-19 viral infection Continue current management with antiviral, remdesivir, day 4 of 5 Continue IV steroids and bronchodilators Continue vitamin C, D3 and zinc.  Type 2 diabetes with hyperglycemia in the setting of high doses of IV steroids. Hemoglobin A1c 7.6 on 10/17/2020. Continue insulin coverage  QTC prolongation Presented with QTC greater than 500. Optimize potassium level greater than 4.0 and magnesium level greater than 2.0. Avoid QTC prolonging agents Repeat twelve-lead EKG in the morning  Rheumatoid arthritis.  No acute concern. -Continue home dose of Plaquenil.  Essential hypertension. BP at goal. -Continue home antihypertensives which include HCTZ, lisinopril and metoprolol. Continue to monitor vital signs.  History of CAD.  Denies any anginal symptoms at the time of this exam. -Continue beta-blocker and statin  History of atrial flutter.  S/p ablation and 06/2019. Currently in sinus rhythm. -Continue home dose of Eliquis follow-up primary CVA prevention.  Adenocarcinoma of prostate. -Outpatient urology follow-up.      Code Status: Full code.  Family Communication: None at bedside.  Disposition Plan: Likely will discharge to home on 10/20/2020   Consultants:  None.  Procedures:  None.  Antimicrobials:  None.  DVT prophylaxis: Eliquis.  Status is: Inpatient    Dispo:  Patient From: Home  Planned Disposition: Home  Expected discharge date: 10/20/2020  Medically stable for discharge: No, ongoing management of COVID-19 viral infection and acute hypoxic respiratory failure.         Objective: Vitals:   10/18/20 1407 10/18/20 2122 10/19/20 0444 10/19/20 1407  BP: 136/78 120/71 119/68 135/67  Pulse: 63 (!) 58 (!) 51 65   Resp: 20 18  15 (!) 22  Temp: 98 F (36.7 C) (!) 97.4 F (36.3 C) 98.5 F (36.9 C) 98.3 F (36.8 C)  TempSrc: Oral Oral Oral   SpO2: 90% 95% 95% (!) 82%  Weight:      Height:        Intake/Output Summary (Last 24 hours) at 10/19/2020 1455 Last data filed at 10/19/2020 1100 Gross per 24 hour  Intake 240 ml  Output 400 ml  Net -160 ml   Filed Weights   10/16/20 1355  Weight: 102.1 kg    Exam:  . General: 79 y.o. year-old male well developed well nourished in no acute distress.  Alert and oriented x3. . Cardiovascular: Regular rate and rhythm with no rubs or gallops.  No thyromegaly or JVD noted.   Marland Kitchen Respiratory: Mild rales at bases with diffuse wheezing noted.  Good inspiratory effort.   . Abdomen: Soft nontender nondistended with normal bowel sounds x4 quadrants. . Musculoskeletal: No lower extremity edema bilaterally.   . Skin: No ulcerative lesions noted or rashes, . Psychiatry: Mood is appropriate for condition and setting   Data Reviewed: CBC: Recent Labs  Lab 10/16/20 1418 10/18/20 0319 10/19/20 0312  WBC 2.5* 4.3 4.8  NEUTROABS 1.3* 3.6 3.6  HGB 11.2* 10.8* 10.7*  HCT 33.8* 34.1* 33.8*  MCV 89.9 92.7 92.1  PLT 140* 177 865   Basic Metabolic Panel: Recent Labs  Lab 10/16/20 1418 10/18/20 0319 10/19/20 0312  NA 137 137 139  K 4.0 4.5 3.7  CL 89* 93* 94*  CO2 35* 32 33*  GLUCOSE 231* 318* 147*  BUN 26* 35* 37*  CREATININE 1.44* 1.42* 1.27*  CALCIUM 8.6* 8.4* 8.3*  MG  --  2.3 2.0   GFR: Estimated Creatinine Clearance: 56.5 mL/min (A) (by C-G formula based on SCr of 1.27 mg/dL (H)). Liver Function Tests: Recent Labs  Lab 10/16/20 1418 10/18/20 0319 10/19/20 0312  AST 73* 57* 47*  ALT 58* 53* 46*  ALKPHOS 63 58 50  BILITOT 0.6 0.2* 0.3  PROT 7.3 7.0 6.4*  ALBUMIN 3.8 3.4* 3.2*   No results for input(s): LIPASE, AMYLASE in the last 168 hours. No results for input(s): AMMONIA in the last 168 hours. Coagulation Profile: No results for  input(s): INR, PROTIME in the last 168 hours. Cardiac Enzymes: No results for input(s): CKTOTAL, CKMB, CKMBINDEX, TROPONINI in the last 168 hours. BNP (last 3 results) No results for input(s): PROBNP in the last 8760 hours. HbA1C: Recent Labs    10/17/20 0358  HGBA1C 7.6*   CBG: Recent Labs  Lab 10/18/20 1139 10/18/20 1614 10/18/20 2124 10/19/20 0737 10/19/20 1114  GLUCAP 310* 256* 190* 111* 140*   Lipid Profile: No results for input(s): CHOL, HDL, LDLCALC, TRIG, CHOLHDL, LDLDIRECT in the last 72 hours. Thyroid Function Tests: No results for input(s): TSH, T4TOTAL, FREET4, T3FREE, THYROIDAB in the last 72 hours. Anemia Panel: No results for input(s): VITAMINB12, FOLATE, FERRITIN, TIBC, IRON, RETICCTPCT in the last 72 hours. Urine analysis:    Component Value Date/Time   COLORURINE YELLOW 03/29/2016 [place] 03/29/2016 1415   LABSPEC 1.018 03/29/2016 1415   PHURINE 5.0 03/29/2016 1415   GLUCOSEU NEGATIVE 03/29/2016 1415   HGBUR NEGATIVE 03/29/2016 [place] 03/29/2016 1415   KETONESUR NEGATIVE 03/29/2016 1415   PROTEINUR NEGATIVE 03/29/2016 1415   NITRITE NEGATIVE 03/29/2016 1415   LEUKOCYTESUR MODERATE (A) 03/29/2016 1415   Sepsis Labs: @LABRCNTIP (procalcitonin:4,lacticidven:4)  ) Recent Results (from the past 240 hour(s))  Blood Culture (routine x 2)     Status: None (Preliminary result)   Collection Time: 10/16/20  2:15 PM   Specimen: Right Antecubital; Blood  Result Value Ref Range Status   Specimen Description   Final    RIGHT ANTECUBITAL Performed at [facility], [address] [place], [place] [address]    Special Requests   Final    BOTTLES DRAWN AEROBIC AND ANAEROBIC Blood Culture adequate volume Performed at [facility], [address] [place], [place] [address]    Culture   Final    NO GROWTH 3 DAYS Performed at [facility], [place] [address], [place], [place] [address]    Report  Status PENDING  Incomplete  Resp Panel by RT-PCR (Flu A&B, Covid) Nasopharyngeal Swab     Status: Abnormal   Collection Time: 10/16/20  2:19 PM   Specimen: Nasopharyngeal Swab; Nasopharyngeal(NP) swabs in vial transport medium  Result Value Ref Range Status   SARS Coronavirus 2 by RT PCR POSITIVE (A) NEGATIVE Final    Comment: RESULT CALLED TO, READ BACK BY AND VERIFIED WITH: Rosemarie Ax, RN AT 9024 ON [id number] BY BOWLBY, J (NOTE) SARS-CoV-2 target nucleic acids are DETECTED.  The SARS-CoV-2 RNA is generally detectable in upper respiratory specimens during the acute phase of infection. Positive results are indicative of the presence of the identified virus, but do not rule out bacterial infection or co-infection with other pathogens not detected by the test. Clinical correlation with patient history and other diagnostic information is necessary to determine patient infection status. The expected result is Negative.  Fact Sheet for Patients: EntrepreneurPulse.com.au  Fact Sheet for Healthcare Providers: IncredibleEmployment.be  This test is not yet approved or cleared by the [place] FDA and  has been authorized for detection and/or diagnosis of SARS-CoV-2 by FDA under an Emergency Use Authorization (EUA).  This EUA will remain in effect (meaning this  test can be used) for the duration of  the COVID-19 declaration under Section 564(b)(1) of the Act, 21 U.S.C. section 360bbb-3(b)(1), unless the authorization is terminated or revoked sooner.     Influenza A by PCR NEGATIVE NEGATIVE Final   Influenza B by PCR NEGATIVE NEGATIVE Final    Comment: (NOTE) The Xpert Xpress SARS-CoV-2/FLU/RSV plus assay is intended as an aid in the diagnosis of influenza from Nasopharyngeal swab specimens and should not be used as a sole basis for treatment. Nasal washings and aspirates are unacceptable for Xpert Xpress SARS-CoV-2/FLU/RSV testing.  Fact Sheet for  Patients: EntrepreneurPulse.com.au  Fact Sheet for Healthcare Providers: IncredibleEmployment.be  This test is not yet approved or cleared by the [place] FDA and has been authorized for detection and/or diagnosis of SARS-CoV-2 by FDA under an Emergency Use Authorization (EUA). This EUA will remain in effect (meaning this test can be used) for the duration of the COVID-19 declaration under Section 564(b)(1) of the Act, 21 U.S.C. section 360bbb-3(b)(1), unless the authorization is terminated or revoked.  Performed at [facility], [address] [place], [place] [address]   Blood Culture (routine x 2)     Status: None (Preliminary result)   Collection Time: 10/16/20  2:37 PM   Specimen: BLOOD LEFT FOREARM  Result Value Ref Range Status   Specimen Description   Final    BLOOD LEFT FOREARM Performed at [facility], [address], [place], [place] [address]    Special Requests   Final  BOTTLES DRAWN AEROBIC AND ANAEROBIC Blood Culture adequate volume Performed at [facility], [address] [place], [place] [address]    Culture   Final    NO GROWTH 3 DAYS Performed at [facility], [place] [address], [place], [place] [address]    Report Status PENDING  Incomplete      Studies: DG CHEST PORT 1 VIEW  Result Date: 10/19/2020 CLINICAL DATA:  COVID-19, cough, dyspnea, malaise EXAM: PORTABLE CHEST 1 VIEW COMPARISON:  10/16/2020 chest radiograph. FINDINGS: Intact sternotomy wires. Stable cardiomediastinal silhouette with normal heart size. No pneumothorax. No pleural effusion. No pulmonary edema. Minimal wispy curvilinear bibasilar lung opacities, similar. IMPRESSION: Minimal wispy bibasilar lung opacities, similar, favor minimal scarring versus atelectasis. Electronically Signed   By: Ilona Sorrel M.D.   On: 10/19/2020 14:37    Scheduled Meds: . apixaban  5 mg Oral BID  . vitamin C  500 mg Oral  Daily  . diclofenac  75 mg Oral BID  . gabapentin  100 mg Oral TID  . hydrochlorothiazide  12.5 mg Oral Daily  . hydroxychloroquine  200 mg Oral BID  . insulin aspart  0-20 Units Subcutaneous TID WC  . insulin aspart  4 Units Subcutaneous TID WC  . insulin glargine  16 Units Subcutaneous BID  . lisinopril  20 mg Oral Daily  . methylPREDNISolone (SOLU-MEDROL) injection  1 mg/kg Intravenous Q12H   Followed by  . [START ON 10/22/2020] predniSONE  50 mg Oral Q breakfast  . metoprolol succinate  50 mg Oral Daily  . potassium chloride  40 mEq Oral Daily  . simvastatin  20 mg Oral Daily  . zinc sulfate  220 mg Oral Daily    Continuous Infusions: . sodium chloride 10 mL/hr at 10/19/20 0907     LOS: 3 days     Kayleen Memos, MD [facility] Pager [phone]  If 7PM-7AM, please contact night-coverage [email or web address] Password [email or web address] 10/19/2020, 2:55 PM

## 2020-10-20 ENCOUNTER — Ambulatory Visit (HOSPITAL_COMMUNITY): Payer: Medicare HMO

## 2020-10-20 LAB — COMPREHENSIVE METABOLIC PANEL
ALT: 45 U/L — ABNORMAL HIGH (ref 0–44)
AST: 43 U/L — ABNORMAL HIGH (ref 15–41)
Albumin: 3.2 g/dL — ABNORMAL LOW (ref 3.5–5.0)
Alkaline Phosphatase: 59 U/L (ref 38–126)
Anion gap: 14 (ref 5–15)
BUN: 38 mg/dL — ABNORMAL HIGH (ref 8–23)
CO2: 32 mmol/L (ref 22–32)
Calcium: 8.4 mg/dL — ABNORMAL LOW (ref 8.9–10.3)
Chloride: 92 mmol/L — ABNORMAL LOW (ref 98–111)
Creatinine, Ser: 1.27 mg/dL — ABNORMAL HIGH (ref 0.61–1.24)
GFR, Estimated: 58 mL/min — ABNORMAL LOW (ref >60)
Glucose, Bld: 254 mg/dL — ABNORMAL HIGH (ref 70–99)
Potassium: 3.9 mmol/L (ref 3.5–5.1)
Sodium: 138 mmol/L (ref 135–145)
Total Bilirubin: 0.2 mg/dL — ABNORMAL LOW (ref 0.3–1.2)
Total Protein: 6.5 g/dL (ref 6.5–8.1)

## 2020-10-20 LAB — CBC WITH DIFFERENTIAL/PLATELET
Abs Immature Granulocytes: 0.06 10*3/uL (ref 0.00–0.07)
Basophils Absolute: 0 10*3/uL (ref 0.0–0.1)
Basophils Relative: 1 %
Eosinophils Absolute: 0 10*3/uL (ref 0.0–0.5)
Eosinophils Relative: 0 %
HCT: 34 % — ABNORMAL LOW (ref 39.0–52.0)
Hemoglobin: 11 g/dL — ABNORMAL LOW (ref 13.0–17.0)
Immature Granulocytes: 2 %
Lymphocytes Relative: 13 %
Lymphs Abs: 0.5 10*3/uL — ABNORMAL LOW (ref 0.7–4.0)
MCH: 29.3 pg (ref 26.0–34.0)
MCHC: 32.4 g/dL (ref 30.0–36.0)
MCV: 90.4 fL (ref 80.0–100.0)
Monocytes Absolute: 0.1 10*3/uL (ref 0.1–1.0)
Monocytes Relative: 3 %
Neutro Abs: 3.2 10*3/uL (ref 1.7–7.7)
Neutrophils Relative %: 81 %
Platelets: 181 10*3/uL (ref 150–400)
RBC: 3.76 MIL/uL — ABNORMAL LOW (ref 4.22–5.81)
RDW: 14.6 % (ref 11.5–15.5)
WBC: 3.9 10*3/uL — ABNORMAL LOW (ref 4.0–10.5)
nRBC: 0 % (ref 0.0–0.2)

## 2020-10-20 LAB — MAGNESIUM: Magnesium: 1.9 mg/dL (ref 1.7–2.4)

## 2020-10-20 LAB — GLUCOSE, CAPILLARY
Glucose-Capillary: 300 mg/dL — ABNORMAL HIGH (ref 70–99)
Glucose-Capillary: 374 mg/dL — ABNORMAL HIGH (ref 70–99)

## 2020-10-20 LAB — C-REACTIVE PROTEIN: CRP: 1.4 mg/dL — ABNORMAL HIGH (ref <1.0)

## 2020-10-20 LAB — D-DIMER, QUANTITATIVE: D-Dimer, Quant: <0.27 ug/mL-FEU (ref 0.00–0.50)

## 2020-10-20 MED ORDER — ALBUTEROL SULFATE HFA 108 (90 BASE) MCG/ACT IN AERS: 2.0000 | INHALATION_SPRAY | Freq: Four times a day (QID) | RESPIRATORY_TRACT | 0 refills | Status: AC | PRN

## 2020-10-20 MED ORDER — FUROSEMIDE 10 MG/ML IJ SOLN
20.0000 mg | Freq: Once | INTRAMUSCULAR | Status: AC
  Administered 2020-10-20: 09:00:00 20 mg via INTRAVENOUS
  Filled 2020-10-20: qty 2

## 2020-10-20 MED ORDER — PREDNISONE 10 MG PO TABS: 10.0000 mg | ORAL_TABLET | Freq: Every day | ORAL | 0 refills | Status: AC

## 2020-10-20 MED ORDER — ZINC SULFATE 220 (50 ZN) MG PO CAPS: 220.0000 mg | ORAL_CAPSULE | Freq: Every day | ORAL | 0 refills | Status: AC

## 2020-10-20 MED ORDER — ASCORBIC ACID 500 MG PO TABS: 500.0000 mg | ORAL_TABLET | Freq: Every day | ORAL | 0 refills | Status: AC

## 2020-10-20 MED ORDER — PANTOPRAZOLE SODIUM 40 MG PO TBEC: 40.0000 mg | DELAYED_RELEASE_TABLET | Freq: Every day | ORAL | 1 refills | Status: AC

## 2020-10-20 NOTE — Discharge Instructions (Signed)
COVID-19 COVID-19 is a respiratory infection that is caused by a virus called severe acute respiratory syndrome coronavirus 2 (SARS-CoV-2). The disease is also known as coronavirus disease or novel coronavirus. In some people, the virus may not cause any symptoms. In others, it may cause a serious infection. The infection can get worse quickly and can lead to complications, such as:  Pneumonia, or infection of the lungs.  Acute respiratory distress syndrome or ARDS. This is a condition in which fluid build-up in the lungs prevents the lungs from filling with air and passing oxygen into the blood.  Acute respiratory failure. This is a condition in which there is not enough oxygen passing from the lungs to the body or when carbon dioxide is not passing from the lungs out of the body.  Sepsis or septic shock. This is a serious bodily reaction to an infection.  Blood clotting problems.  Secondary infections due to bacteria or fungus.  Organ failure. This is when your body's organs stop working. The virus that causes COVID-19 is contagious. This means that it can spread from person to person through droplets from coughs and sneezes (respiratory secretions). What are the causes? This illness is caused by a virus. You may catch the virus by:  Breathing in droplets from an infected person. Droplets can be spread by a person breathing, speaking, singing, coughing, or sneezing.  Touching something, like a table or a doorknob, that was exposed to the virus (contaminated) and then touching your mouth, nose, or eyes. What increases the risk? Risk for infection You are more likely to be infected with this virus if you:  Are within 6 feet (2 meters) of a person with COVID-19.  Provide care for or live with a person who is infected with COVID-19.  Spend time in crowded indoor spaces or live in shared housing. Risk for serious illness You are more likely to become seriously ill from the virus if  you:  Are 50 years of age or older. The higher your age, the more you are at risk for serious illness.  Live in a nursing home or long-term care facility.  Have cancer.  Have a long-term (chronic) disease such as: ? Chronic lung disease, including chronic obstructive pulmonary disease or asthma. ? A long-term disease that lowers your body's ability to fight infection (immunocompromised). ? Heart disease, including heart failure, a condition in which the arteries that lead to the heart become narrow or blocked (coronary artery disease), a disease which makes the heart muscle thick, weak, or stiff (cardiomyopathy). ? Diabetes. ? Chronic kidney disease. ? Sickle cell disease, a condition in which red blood cells have an abnormal "sickle" shape. ? Liver disease.  Are obese. What are the signs or symptoms? Symptoms of this condition can range from mild to severe. Symptoms may appear any time from 2 to 14 days after being exposed to the virus. They include:  A fever or chills.  A cough.  Difficulty breathing.  Headaches, body aches, or muscle aches.  Runny or stuffy (congested) nose.  A sore throat.  New loss of taste or smell. Some people may also have stomach problems, such as nausea, vomiting, or diarrhea. Other people may not have any symptoms of COVID-19. How is this diagnosed? This condition may be diagnosed based on:  Your signs and symptoms, especially if: ? You live in an area with a COVID-19 outbreak. ? You recently traveled to or from an area where the virus is common. ? You   provide care for or live with a person who was diagnosed with COVID-19. ? You were exposed to a person who was diagnosed with COVID-19.  A physical exam.  Lab tests, which may include: ? Taking a sample of fluid from the back of your nose and throat (nasopharyngeal fluid), your nose, or your throat using a swab. ? A sample of mucus from your lungs (sputum). ? Blood tests.  Imaging tests,  which may include, X-rays, CT scan, or ultrasound. How is this treated? At present, there is no medicine to treat COVID-19. Medicines that treat other diseases are being used on a trial basis to see if they are effective against COVID-19. Your health care provider will talk with you about ways to treat your symptoms. For most people, the infection is mild and can be managed at home with rest, fluids, and over-the-counter medicines. Treatment for a serious infection usually takes places in a hospital intensive care unit (ICU). It may include one or more of the following treatments. These treatments are given until your symptoms improve.  Receiving fluids and medicines through an IV.  Supplemental oxygen. Extra oxygen is given through a tube in the nose, a face mask, or a hood.  Positioning you to lie on your stomach (prone position). This makes it easier for oxygen to get into the lungs.  Continuous positive airway pressure (CPAP) or bi-level positive airway pressure (BPAP) machine. This treatment uses mild air pressure to keep the airways open. A tube that is connected to a motor delivers oxygen to the body.  Ventilator. This treatment moves air into and out of the lungs by using a tube that is placed in your windpipe.  Tracheostomy. This is a procedure to create a hole in the neck so that a breathing tube can be inserted.  Extracorporeal membrane oxygenation (ECMO). This procedure gives the lungs a chance to recover by taking over the functions of the heart and lungs. It supplies oxygen to the body and removes carbon dioxide. Follow these instructions at home: Lifestyle  If you are sick, stay home except to get medical care. Your health care provider will tell you how long to stay home. Call your health care provider before you go for medical care.  Rest at home as told by your health care provider.  Do not use any products that contain nicotine or tobacco, such as cigarettes,  e-cigarettes, and chewing tobacco. If you need help quitting, ask your health care provider.  Return to your normal activities as told by your health care provider. Ask your health care provider what activities are safe for you. General instructions  Take over-the-counter and prescription medicines only as told by your health care provider.  Drink enough fluid to keep your urine pale yellow.  Keep all follow-up visits as told by your health care provider. This is important. How is this prevented?  There is no vaccine to help prevent COVID-19 infection. However, there are steps you can take to protect yourself and others from this virus. To protect yourself:   Do not travel to areas where COVID-19 is a risk. The areas where COVID-19 is reported change often. To identify high-risk areas and travel restrictions, check the CDC travel website: wwwnc.cdc.gov/travel/notices  If you live in, or must travel to, an area where COVID-19 is a risk, take precautions to avoid infection. ? Stay away from people who are sick. ? Wash your hands often with soap and water for 20 seconds. If soap and water   are not available, use an alcohol-based hand sanitizer. ? Avoid touching your mouth, face, eyes, or nose. ? Avoid going out in public, follow guidance from your state and local health authorities. ? If you must go out in public, wear a cloth face covering or face mask. Make sure your mask covers your nose and mouth. ? Avoid crowded indoor spaces. Stay at least 6 feet (2 meters) away from others. ? Disinfect objects and surfaces that are frequently touched every day. This may include:  Counters and tables.  Doorknobs and light switches.  Sinks and faucets.  Electronics, such as phones, remote controls, keyboards, computers, and tablets. To protect others: If you have symptoms of COVID-19, take steps to prevent the virus from spreading to others.  If you think you have a COVID-19 infection, contact  your health care provider right away. Tell your health care team that you think you may have a COVID-19 infection.  Stay home. Leave your house only to seek medical care. Do not use public transport.  Do not travel while you are sick.  Wash your hands often with soap and water for 20 seconds. If soap and water are not available, use alcohol-based hand sanitizer.  Stay away from other members of your household. Let healthy household members care for children and pets, if possible. If you have to care for children or pets, wash your hands often and wear a mask. If possible, stay in your own room, separate from others. Use a different bathroom.  Make sure that all people in your household wash their hands well and often.  Cough or sneeze into a tissue or your sleeve or elbow. Do not cough or sneeze into your hand or into the air.  Wear a cloth face covering or face mask. Make sure your mask covers your nose and mouth. Where to find more information  Centers for Disease Control and Prevention: www.cdc.gov/coronavirus/2019-ncov/index.html  World Health Organization: www.who.int/health-topics/coronavirus Contact a health care provider if:  You live in or have traveled to an area where COVID-19 is a risk and you have symptoms of the infection.  You have had contact with someone who has COVID-19 and you have symptoms of the infection. Get help right away if:  You have trouble breathing.  You have pain or pressure in your chest.  You have confusion.  You have bluish lips and fingernails.  You have difficulty waking from sleep.  You have symptoms that get worse. These symptoms may represent a serious problem that is an emergency. Do not wait to see if the symptoms will go away. Get medical help right away. Call your local emergency services (911 in the [place] Do not drive yourself to the hospital. Let the emergency medical personnel know if you think you have  COVID-19. Summary  COVID-19 is a respiratory infection that is caused by a virus. It is also known as coronavirus disease or novel coronavirus. It can cause serious infections, such as pneumonia, acute respiratory distress syndrome, acute respiratory failure, or sepsis.  The virus that causes COVID-19 is contagious. This means that it can spread from person to person through droplets from breathing, speaking, singing, coughing, or sneezing.  You are more likely to develop a serious illness if you are 50 years of age or older, have a weak immune system, live in a nursing home, or have chronic disease.  There is no medicine to treat COVID-19. Your health care provider will talk with you about ways to treat your symptoms.    Take steps to protect yourself and others from infection. Wash your hands often and disinfect objects and surfaces that are frequently touched every day. Stay away from people who are sick and wear a mask if you are sick. This information is not intended to replace advice given to you by your health care provider. Make sure you discuss any questions you have with your health care provider. Document Revised: 08/17/2019 Document Reviewed: 11/23/2018 Elsevier Patient Education  [place] Can Do to Manage Your COVID-19 Symptoms at Home If you have possible or confirmed COVID-19: 1. Stay home from work and school. And stay away from other public places. If you must go out, avoid using any kind of public transportation, ridesharing, or taxis. 2. Monitor your symptoms carefully. If your symptoms get worse, call your healthcare provider immediately. 3. Get rest and stay hydrated. 4. If you have a medical appointment, call the healthcare provider ahead of time and tell them that you have or may have COVID-19. 5. For medical emergencies, call 911 and notify the dispatch personnel that you have or may have COVID-19. 6. Cover your cough and sneezes with a tissue or  use the inside of your elbow. 7. Wash your hands often with soap and water for at least 20 seconds or clean your hands with an alcohol-based hand sanitizer that contains at least 60% alcohol. 8. As much as possible, stay in a specific room and away from other people in your home. Also, you should use a separate bathroom, if available. If you need to be around other people in or outside of the home, wear a mask. 9. Avoid sharing personal items with other people in your household, like dishes, towels, and bedding. 10. Clean all surfaces that are touched often, like counters, tabletops, and doorknobs. Use household cleaning sprays or wipes according to the label instructions. [email or web address] 05/02/2019 This information is not intended to replace advice given to you by your health care provider. Make sure you discuss any questions you have with your health care provider. Document Revised: 10/04/2019 Document Reviewed: 10/04/2019 Elsevier Patient Education  [place].  COVID-19: How to Protect Yourself and Others Know how it spreads  There is currently no vaccine to prevent coronavirus disease 2019 (COVID-19).  The best way to prevent illness is to avoid being exposed to this virus.  The virus is thought to spread mainly from person-to-person. ? Between people who are in close contact with one another (within about 6 feet). ? Through respiratory droplets produced when an infected person coughs, sneezes or talks. ? These droplets can land in the mouths or noses of people who are nearby or possibly be inhaled into the lungs. ? COVID-19 may be spread by people who are not showing symptoms. Everyone should Clean your hands often  Wash your hands often with soap and water for at least 20 seconds especially after you have been in a public place, or after blowing your nose, coughing, or sneezing.  If soap and water are not readily available, use a hand sanitizer that contains at least  60% alcohol. Cover all surfaces of your hands and rub them together until they feel dry.  Avoid touching your eyes, nose, and mouth with unwashed hands. Avoid close contact  Limit contact with others as much as possible.  Avoid close contact with people who are sick.  Put distance between yourself and other people. ? Remember that some people without symptoms may be able to  spread virus. ? This is especially important for people who are at higher risk of getting very GainPain.com.cy Cover your mouth and nose with a mask when around others  You could spread COVID-19 to others even if you do not feel sick.  Everyone should wear a mask in public settings and when around people not living in their household, especially when social distancing is difficult to maintain. ? Masks should not be placed on young children under age 72, anyone who has trouble breathing, or is unconscious, incapacitated or otherwise unable to remove the mask without assistance.  The mask is meant to protect other people in case you are infected.  Do NOT use a facemask meant for a Dietitian.  Continue to keep about 6 feet between yourself and others. The mask is not a substitute for social distancing. Cover coughs and sneezes  Always cover your mouth and nose with a tissue when you cough or sneeze or use the inside of your elbow.  Throw used tissues in the trash.  Immediately wash your hands with soap and water for at least 20 seconds. If soap and water are not readily available, clean your hands with a hand sanitizer that contains at least 60% alcohol. Clean and disinfect  Clean AND disinfect frequently touched surfaces daily. This includes tables, doorknobs, light switches, countertops, handles, desks, phones, keyboards, toilets, faucets, and sinks.  [email or web address]  If surfaces are dirty, clean them: Use detergent or soap and water prior to disinfection.  Then, use a household disinfectant. You can see a list of EPA-registered household disinfectants here. [email or web address] 07/04/2019 This information is not intended to replace advice given to you by your health care provider. Make sure you discuss any questions you have with your health care provider. Document Revised: 07/12/2019 Document Reviewed: 05/10/2019 Elsevier Patient Education  [place].   COVID-19 Frequently Asked Questions COVID-19 (coronavirus disease) is an infection that is caused by a large family of viruses. Some viruses cause illness in people and others cause illness in animals like camels, cats, and bats. In some cases, the viruses that cause illness in animals can spread to humans. Where did the coronavirus come from? In December 2019, [place] told the [facility]) of several cases of lung disease (human respiratory illness). These cases were linked to an open seafood and livestock market in the city of [place]. The link to the seafood and livestock market suggests that the virus may have spread from animals to humans. However, since that first outbreak in December, the virus has also been shown to spread from person to person. What is the name of the disease and the virus? Disease name Early on, this disease was called novel coronavirus. This is because scientists determined that the disease was caused by a new (novel) respiratory virus. The World Health Organization [facility]) has now named the disease COVID-19, or coronavirus disease. Virus name The virus that causes the disease is called severe acute respiratory syndrome coronavirus 2 (SARS-CoV-2). More information on disease and virus naming World Health Organization [facility]):  www.who.int/emergencies/diseases/novel-coronavirus-2019/technical-guidance/naming-the-coronavirus-disease-(covid-2019)-and-the-virus-that-causes-it Who is at risk for complications from coronavirus disease? Some people may be at higher risk for complications from coronavirus disease. This includes older adults and people who have chronic diseases, such as heart disease, diabetes, and lung disease. If you are at higher risk for complications, take these extra precautions:  Stay home as much as possible.  Avoid social gatherings and travel.  Avoid close contact with others. Stay at least  6 ft (2 m) away from others, if possible.  Wash your hands often with soap and water for at least 20 seconds.  Avoid touching your face, mouth, nose, or eyes.  Keep supplies on hand at home, such as food, medicine, and cleaning supplies.  If you must go out in public, wear a cloth face covering or face mask. Make sure your mask covers your nose and mouth. How does coronavirus disease spread? The virus that causes coronavirus disease spreads easily from person to person (is contagious). You may catch the virus by:  Breathing in droplets from an infected person. Droplets can be spread by a person breathing, speaking, singing, coughing, or sneezing.  Touching something, like a table or a doorknob, that was exposed to the virus (contaminated) and then touching your mouth, nose, or eyes. Can I get the virus from touching surfaces or objects? There is still a lot that we do not know about the virus that causes coronavirus disease. Scientists are basing a lot of information on what they know about similar viruses, such as:  Viruses cannot generally survive on surfaces for long. They need a human body (host) to survive.  It is more likely that the virus is spread by close contact with people who are sick (direct contact), such as through: ? Shaking hands or hugging. ? Breathing in respiratory droplets that  travel through the air. Droplets can be spread by a person breathing, speaking, singing, coughing, or sneezing.  It is less likely that the virus is spread when a person touches a surface or object that has the virus on it (indirect contact). The virus may be able to enter the body if the person touches a surface or object and then touches his or her face, eyes, nose, or mouth. Can a person spread the virus without having symptoms of the disease? It may be possible for the virus to spread before a person has symptoms of the disease, but this is most likely not the main way the virus is spreading. It is more likely for the virus to spread by being in close contact with people who are sick and breathing in the respiratory droplets spread by a person breathing, speaking, singing, coughing, or sneezing. What are the symptoms of coronavirus disease? Symptoms vary from person to person and can range from mild to severe. Symptoms may include:  Fever or chills.  Cough.  Difficulty breathing or feeling short of breath.  Headaches, body aches, or muscle aches.  Runny or stuffy (congested) nose.  Sore throat.  New loss of taste or smell.  Nausea, vomiting, or diarrhea. These symptoms can appear anywhere from 2 to 14 days after you have been exposed to the virus. Some people may not have any symptoms. If you develop symptoms, call your health care provider. People with severe symptoms may need hospital care. Should I be tested for this virus? Your health care provider will decide whether to test you based on your symptoms, history of exposure, and your risk factors. How does a health care provider test for this virus? Health care providers will collect samples to send for testing. Samples may include:  Taking a swab of fluid from the back of your nose and throat, your nose, or your throat.  Taking fluid from the lungs by having you cough up mucus (sputum) into a sterile cup.  Taking a blood  sample. Is there a treatment or vaccine for this virus? Currently, there is no vaccine to prevent  coronavirus disease. Also, there are no medicines like antibiotics or antivirals to treat the virus. A person who becomes sick is given supportive care, which means rest and fluids. A person may also relieve his or her symptoms by using over-the-counter medicines that treat sneezing, coughing, and runny nose. These are the same medicines that a person takes for the common cold. If you develop symptoms, call your health care provider. People with severe symptoms may need hospital care. What can I do to protect myself and my family from this virus?     You can protect yourself and your family by taking the same actions that you would take to prevent the spread of other viruses. Take the following actions:  Wash your hands often with soap and water for at least 20 seconds. If soap and water are not available, use alcohol-based hand sanitizer.  Avoid touching your face, mouth, nose, or eyes.  Cough or sneeze into a tissue, sleeve, or elbow. Do not cough or sneeze into your hand or the air. ? If you cough or sneeze into a tissue, throw it away immediately and wash your hands.  Disinfect objects and surfaces that you frequently touch every day.  Stay away from people who are sick.  Avoid going out in public, follow guidance from your state and local health authorities.  Avoid crowded indoor spaces. Stay at least 6 ft (2 m) away from others.  If you must go out in public, wear a cloth face covering or face mask. Make sure your mask covers your nose and mouth.  Stay home if you are sick, except to get medical care. Call your health care provider before you get medical care. Your health care provider will tell you how long to stay home.  Make sure your vaccines are up to date. Ask your health care provider what vaccines you need. What should I do if I need to travel? Follow travel recommendations  from your local health authority, the CDC, and WHO. Travel information and advice  Centers for Disease Control and Prevention (CDC): BodyEditor.hu  World Health Organization [facility]): ThirdIncome.ca Know the risks and take action to protect your health  You are at higher risk of getting coronavirus disease if you are traveling to areas with an outbreak or if you are exposed to travelers from areas with an outbreak.  Wash your hands often and practice good hygiene to lower the risk of catching or spreading the virus. What should I do if I am sick? General instructions to stop the spread of infection  Wash your hands often with soap and water for at least 20 seconds. If soap and water are not available, use alcohol-based hand sanitizer.  Cough or sneeze into a tissue, sleeve, or elbow. Do not cough or sneeze into your hand or the air.  If you cough or sneeze into a tissue, throw it away immediately and wash your hands.  Stay home unless you must get medical care. Call your health care provider or local health authority before you get medical care.  Avoid public areas. Do not take public transportation, if possible.  If you can, wear a mask if you must go out of the house or if you are in close contact with someone who is not sick. Make sure your mask covers your nose and mouth. Keep your home clean  Disinfect objects and surfaces that are frequently touched every day. This may include: ? Counters and tables. ? Doorknobs and light switches. ?  Sinks and faucets. ? Electronics such as phones, remote controls, keyboards, computers, and tablets.  Wash dishes in hot, soapy water or use a dishwasher. Air-dry your dishes.  Wash laundry in hot water. Prevent infecting other household members  Let healthy household members care for children and pets, if possible. If you have to care for children or  pets, wash your hands often and wear a mask.  Sleep in a different bedroom or bed, if possible.  Do not share personal items, such as razors, toothbrushes, deodorant, combs, brushes, towels, and washcloths. Where to find more information Centers for Disease Control and Prevention (CDC)  Information and news updates: https://www.butler-gonzalez.com/ World Health Organization [facility])  Information and news updates: MissExecutive.com.ee  Coronavirus health topic: https://www.castaneda.info/  Questions and answers on COVID-19: OpportunityDebt.at  Global tracker: who.sprinklr.com American Academy of Pediatrics (AAP)  Information for families: www.healthychildren.org/English/health-issues/conditions/chest-lungs/Pages/2019-Novel-Coronavirus.aspx The coronavirus situation is changing rapidly. Check your local health authority website or the CDC and [facility] websites for updates and news. When should I contact a health care provider?  Contact your health care provider if you have symptoms of an infection, such as fever or cough, and you: ? Have been near anyone who is known to have coronavirus disease. ? Have come into contact with a person who is suspected to have coronavirus disease. ? Have traveled to an area where there is an outbreak of COVID-19. When should I get emergency medical care?  Get help right away by calling your local emergency services (911 in the [place] if you have: ? Trouble breathing. ? Pain or pressure in your chest. ? Confusion. ? Blue-tinged lips and fingernails. ? Difficulty waking from sleep. ? Symptoms that get worse. Let the emergency medical personnel know if you think you have coronavirus disease. Summary  A new respiratory virus is spreading from person to person and causing COVID-19 (coronavirus disease).  The virus that causes COVID-19 appears to spread easily. It spreads from one  person to another through droplets from breathing, speaking, singing, coughing, or sneezing.  Older adults and those with chronic diseases are at higher risk of disease. If you are at higher risk for complications, take extra precautions.  There is currently no vaccine to prevent coronavirus disease. There are no medicines, such as antibiotics or antivirals, to treat the virus.  You can protect yourself and your family by washing your hands often, avoiding touching your face, and covering your coughs and sneezes. This information is not intended to replace advice given to you by your health care provider. Make sure you discuss any questions you have with your health care provider. Document Revised: 08/17/2019 Document Reviewed: 02/13/2019 Elsevier Patient Education  [place]: Quarantine vs. Isolation QUARANTINE keeps someone who was in close contact with someone who has COVID-19 away from others. If you had close contact with a person who has COVID-19  Stay home until 14 days after your last contact.  Check your temperature twice a day and watch for symptoms of COVID-19.  If possible, stay away from people who are at higher-risk for getting very sick from COVID-19. ISOLATION keeps someone who is sick or tested positive for COVID-19 without symptoms away from others, even in their own home. If you are sick and think or know you have COVID-19  Stay home until after ? At least 10 days since symptoms first appeared and ? At least 24 hours with no fever without fever-reducing medication and ? Symptoms have improved If you tested positive  for COVID-19 but do not have symptoms  Stay home until after ? 10 days have passed since your positive test If you live with others, stay in a specific "sick room" or area and away from other people or animals, including pets. Use a separate bathroom, if available. [email or web address] 05/21/2019 This information is not intended to  replace advice given to you by your health care provider. Make sure you discuss any questions you have with your health care provider. Document Revised: 10/04/2019 Document Reviewed: 10/04/2019 Elsevier Patient Education  [place].   Prone Position Therapy  Prone position therapy, also called prone positioning, is when a person is put on his or her stomach to make breathing easier. This therapy usually takes place in a hospital intensive care unit (ICU). Most people will be asleep during the therapy, but sometimes, the person is awake. During this therapy, the person will be on his or her stomach for as long as possible. This may be for 16 or more hours each day. Often, this therapy is used when a person:  Has acute respiratory distress syndrome (ARDS). This condition is very serious because fluid collects in the lungs. When this happens, the lungs cannot fill with air like normal and oxygen cannot pass to the blood.  Requires a ventilator to help him or her breathe. A ventilator is a machine that moves air in and out of the lungs. Prone position therapy may make it easier for oxygen to pass to the blood in the lungs. It may also prevent injury to the lungs from being on a ventilator. What are the risks? The risks may include:  Tubes becoming disconnected or blocked. Examples include breathing tubes, drains, or IVs.  Pressure injuries to the skin and tissue. This happens when skin presses against a surface, such as a mattress, for too long.  Nerve injuries from being in one position for a long time.  Buildup of fluid in the face.  Inhaling food, saliva, or liquid into the lungs (aspirating).  Not responding to this therapy and the condition gets worse instead of better. What happens before the treatment? Your health care provider will make sure it is safe to perform the therapy. To do this, your health care provider may check:  Your blood pressure, heart rate, breathing rate,  and blood oxygen level.  Your skin. A protective barrier will be applied to your face and any areas that may have a lot of pressure during therapy.  Your level of alertness and pain.  Any tubes connected to you. All lines, tubes, and drains will be secured. You may be given medicines to:  Help you relax (sedatives).  Control blood pressure.  Control pain. What happens during the treatment?  Your health care providers, or a special moving bed, will lift and turn you onto your stomach.  Certain bedding will be used to help prevent pressure injuries. This may include a pillow, pad, mattress, or cushion that is filled with gel, air, water, or foam.  Your head, arms, and legs will be moved into different positions every hour.  Any medical devices and braces will be adjusted as needed to limit pressure on your skin. Your health care providers may also place padding between the skin and the device.  Your health care providers will regularly check: ? Your skin for swelling and signs of pressure injuries. ? Your breathing tubes, drains, or IVs to make sure these are still in place and not blocked. ? Your blood  to measure the levels of oxygen and carbon dioxide (arterial blood gases test). This test tells your health care provider if the therapy is helping. ? Your blood pressure, heart rate, breathing rate, and blood oxygen level. ? Your ventilator to make sure that all connections are secure and that the machine is working correctly. Ventilators have alarms that go off if something needs to be checked. The procedure may vary among health care providers and hospitals. What can I expect after the treatment?  You will be moved from your stomach to your back.  More arterial blood gases tests will be done to make sure the treatment helped.  Your blood pressure, heart rate, breathing rate, and blood oxygen level will be monitored. Summary  Prone position therapy, also called prone  positioning, is when a person is put on his or her stomach to make breathing easier.  Prone position therapy may make it easier for oxygen to pass to the blood in the lungs. It may also prevent injury to the lungs from the ventilator.  Your health care providers, or a special moving bed, will lift and turn you onto your stomach. This information is not intended to replace advice given to you by your health care provider. Make sure you discuss any questions you have with your health care provider. Document Revised: 07/06/2019 Document Reviewed: 07/06/2019 Elsevier Patient Education  [place].   Hypoxia Hypoxia is a condition that happens when there is a lack of oxygen in the body's tissues and organs. When there is not enough oxygen, organs cannot work as they should. This causes serious problems throughout the body and in the brain. What are the causes? This condition may be caused by:  Exposure to high altitude.  A collapsed lung (pneumothorax).  Lung infection (pneumonia).  Lung injury.  Long-term (chronic) lung disease, such as COPD (chronic obstructive pulmonary disease).  Blood collecting in the chest cavity (hemothorax).  Food, saliva, or vomit getting into the airway (aspiration).  Reduced blood flow (ischemia).  Severe blood loss.  Slow or shallow breathing (hypoventilation).  Blood disorders, such as anemia.  Carbon monoxide poisoning.  The heart suddenly stopping (cardiac arrest).  Anesthetic medicines.  Drowning.  Choking. What are the signs or symptoms? Symptoms of this condition include:  Headache.  Fatigue.  Drowsiness.  Forgetfulness.  Nausea.  Confusion.  Shortness of breath.  Dizziness.  Bluish color of the skin, lips, or nail beds (cyanosis).  Change in consciousness or awareness. If hypoxia is not treated, it can lead to convulsions, loss of consciousness (coma), or brain damage. How is this diagnosed? This condition  may be diagnosed based on:  A physical exam.  Blood tests.  A test that measures how much oxygen is in your blood (pulse oximetry). This is done with a sensor that is placed on your finger, toe, or earlobe.  Chest X-ray.  Tests to check your lung function (pulmonary function tests).  A test to check the electrical activity of your heart (electrocardiogram, ECG). You may have other tests to determine the cause of your hypoxia. How is this treated?  Treatment for this condition depends on what is causing the hypoxia. You will likely be treated with oxygen therapy. This may be done by giving you oxygen through a face mask or through tubes in your nose. Your health care provider may also recommend other therapies to treat the underlying cause of your hypoxia. Follow these instructions at home:  Take over-the-counter and prescription medicines only  as told by your health care provider.  Do not use any products that contain nicotine or tobacco, such as cigarettes and e-cigarettes. If you need help quitting, ask your health care provider.  Avoid secondhand smoke.  Work with your health care provider to manage any chronic conditions you have that may be causing hypoxia, such as COPD.  Keep all follow-up visits as told by your health care provider. This is important. Contact a health care provider if:  You have a fever.  You have trouble breathing, even after treatment.  You become extremely short of breath when you exercise. Get help right away if:  Your shortness of breath gets worse, especially with normal or very little activity.  Your skin, lips, or nail beds have a bluish color.  You become confused or you cannot think properly.  You have chest pain. Summary  Hypoxia is a condition that happens when there is a lack of oxygen in the body's tissues and organs.  If hypoxia is not treated, it can lead to convulsions, loss of consciousness (coma), or brain damage.  Symptoms  of hypoxia can include a headache, shortness of breath, confusion, nausea, and a bluish skin color.  Hypoxia has many possible causes, including exposure to high altitude, carbon monoxide poisoning, or other health issues, such as blood disorders or cardiac arrest.  Hypoxia is usually treated with oxygen therapy. This information is not intended to replace advice given to you by your health care provider. Make sure you discuss any questions you have with your health care provider. Document Revised: 09/30/2017 Document Reviewed: 12/06/2016 Elsevier Patient Education  [place].  How to Safely Wear and Take Off a Mask Wear your face mask correctly  Wash your hands before putting on your mask  Put it over your nose and mouth and secure it under your chin  Try to fit it snugly against the sides of your face  Make sure you can breathe easily  Do not place a mask on a child younger than 2 Use the mask to help protect others  Wear a mask to help protect others in case you're infected but don't have symptoms  Keep the mask on your face the entire time you're in public  Don't put the mask around your neck or up on your forehead  Don't touch the mask, and, if you do, clean your hands Follow everyday health habits  Stay at least 6 feet away from others  Avoid contact with people who are sick  Wash your hands often, with soap and water, for at least 20 seconds each time  Use hand sanitizer if soap and water are not available Take off your mask carefully, when you're home  Untie the strings behind your head or stretch the ear loops  Handle only by the ear loops or ties  Fold outside corners together  Place covering in the washing machine  Wash your hands with soap and water Personal masks are not surgical masks or N-95 respirators, both of which should be saved for health care workers and other medical first responders. For instructions on making a cloth face covering,  see: [email or web address] 08/08/2019 This information is not intended to replace advice given to you by your health care provider. Make sure you discuss any questions you have with your health care provider. Document Revised: 10/04/2019 Document Reviewed: 10/04/2019 Elsevier Patient Education  [place] if You Are Sick If you are sick with  COVID-19 or think you might have COVID-19, follow the steps below to care for yourself and to help protect other people in your home and community. Stay home except to get medical care.  Stay home. Most people with COVID-19 have mild illness and are able to recover at home without medical care. Do not leave your home, except to get medical care. Do not visit public areas.  Take care of yourself. Get rest and stay hydrated. Take over-the-counter medicines, such as acetaminophen, to help you feel better.  Stay in touch with your doctor. Call before you get medical care. Be sure to get care if you have trouble breathing, or have any other emergency warning signs, or if you think it is an emergency.  Avoid public transportation, ride-sharing, or taxis. Separate yourself from other people and pets in your home.  As much as possible, stay in a specific room and away from other people and pets in your home. Also, you should use a separate bathroom, if available. If you need to be around other people or animals in or outside of the home, wear a mask. ? See COVID-19 and Animals if you have questions about USFirm.ch. ? Additional guidance is available for those living in close quarters. (http://www.turner-rogers.com/.html) and shared housing (TVStereos.ch). Monitor your symptoms.  Symptoms of COVID-19 include fever, cough, and shortness of breath  but other symptoms may be present as well.  Follow care instructions from your healthcare provider and local health department. Your local health authorities will give instructions on checking your symptoms and reporting information. When to Seek Emergency Medical Attention Look for emergency warning signs* for COVID-19. If someone is showing any of these signs, seek emergency medical care immediately:  Trouble breathing  Persistent pain or pressure in the chest  New confusion  Bluish lips or face  Inability to wake or stay awake *This list is not all possible symptoms. Please call your medical provider for any other symptoms that are severe or concerning to you. Call 911 or call ahead to your local emergency facility: Notify the operator that you are seeking care for someone who has or may have COVID-19. Call ahead before visiting your doctor.  Call ahead. Many medical visits for routine care are being postponed or done by phone or telemedicine.  If you have a medical appointment that cannot be postponed, call your doctor's office, and tell them you have or may have COVID-19. If you are sick, wear a mask over your nose and mouth.  You should wear a mask over your nose and mouth if you must be around other people or animals, including pets (even at home).  You don't need to wear the mask if you are alone. If you can't put on a mask (because of trouble breathing for example), cover your coughs and sneezes in some other way. Try to stay at least 6 feet away from other people. This will help protect the people around you.  Masks should not be placed on young children under age 55 years, anyone who has trouble breathing, or anyone who is not able to remove the mask without help. Note: During the COVID-19 pandemic, medical grade facemasks are reserved for healthcare workers and some first responders. You may need to make a mask using a scarf or bandana. Cover your coughs and sneezes.  Cover  your mouth and nose with a tissue when you cough or sneeze.  Throw used tissues in a lined trash can.  Immediately wash your hands  with soap and water for at least 20 seconds. If soap and water are not available, clean your hands with an alcohol-based hand sanitizer that contains at least 60% alcohol. Clean your hands often.  Wash your hands often with soap and water for at least 20 seconds. This is especially important after blowing your nose, coughing, or sneezing; going to the bathroom; and before eating or preparing food.  Use hand sanitizer if soap and water are not available. Use an alcohol-based hand sanitizer with at least 60% alcohol, covering all surfaces of your hands and rubbing them together until they feel dry.  Soap and water are the best option, especially if your hands are visibly dirty.  Avoid touching your eyes, nose, and mouth with unwashed hands. Avoid sharing personal household items.  Do not share dishes, drinking glasses, cups, eating utensils, towels, or bedding with other people in your home.  Wash these items thoroughly after using them with soap and water or put them in the dishwasher. Clean all "high-touch" surfaces everyday.  Clean and disinfect high-touch surfaces in your "sick room" and bathroom. Let someone else clean and disinfect surfaces in common areas, but not your bedroom and bathroom.  If a caregiver or other person needs to clean and disinfect a sick person's bedroom or bathroom, they should do so on an as-needed basis. The caregiver/other person should wear a mask and wait as long as possible after the sick person has used the bathroom. High-touch surfaces include phones, remote controls, counters, tabletops, doorknobs, bathroom fixtures, toilets, keyboards, tablets, and bedside tables.  Clean and disinfect areas that may have blood, stool, or body fluids on them.  Use household cleaners and disinfectants. Clean the area or item with soap and  water or another detergent if it is dirty. Then use a household disinfectant. ? Be sure to follow the instructions on the label to ensure safe and effective use of the product. Many products recommend keeping the surface wet for several minutes to ensure germs are killed. Many also recommend precautions such as wearing gloves and making sure you have good ventilation during use of the product. ? Most EPA-registered household disinfectants should be effective. When you can be around others after you had or likely had COVID-19 When you can be around others (end home isolation) depends on different factors for different situations.  I think or know I had COVID-19, and I had symptoms ? You can be with others after  24 hours with no fever AND  Symptoms improved AND  10 days since symptoms first appeared ? Depending on your healthcare provider's advice and availability of testing, you might get tested to see if you still have COVID-19. If you will be tested, you can be around others when you have no fever, symptoms have improved, and you receive two negative test results in a row, at least 24 hours apart.  I tested positive for COVID-19 but had no symptoms ? If you continue to have no symptoms, you can be with others after:  10 days have passed since test ? Depending on your healthcare provider's advice and availability of testing, you might get tested to see if you still have COVID-19. If you will be tested, you can be around others after you receive two negative test results in a row, at least 24 hours apart. ? If you develop symptoms after testing positive, follow the guidance above for "I think or know I had COVID, and I had symptoms." [email or web address] 06/12/2019 This  information is not intended to replace advice given to you by your health care provider. Make sure you discuss any questions you have with your health care provider. Document Revised: 06/28/2019 Document Reviewed:  05/01/2019 Elsevier Patient Education  [place].   Hypoxemia  Hypoxemia occurs when the blood does not contain enough oxygen. The body cannot work well when it does not have enough oxygen because every part of the body needs oxygen. Oxygen enters the lungs when we breathe in, then it travels to all parts of the body through the blood. Hypoxemia can develop suddenly or slowly. What are the causes? Common causes of this condition include:  Long-term (chronic) lung diseases, such as chronic obstructive pulmonary disease (COPD) or interstitial lung disease.  Disorders that affect breathing at night, such as sleep apnea.  Fluid buildup in the lungs (pulmonary edema).  Lung infection (pneumonia).  Lung or throat cancer.  Abnormal blood flow that bypasses the lungs (having a shunt).  Certain diseases that affect nerves or muscles.  A collapsed lung (pneumothorax).  A blood clot in the lungs (pulmonary embolus).  Certain types of heart disease.  Slow or shallow breathing (hypoventilation).  Certain medicines.  High altitudes.  Toxic chemicals, smoke, and gases. What are the signs or symptoms? In some cases, there may be no symptoms of this condition. If you do have symptoms, they may include:  Shortness of breath (dyspnea).  Bluish color of the skin, lips, or nail beds.  Breathing that is fast, noisy, or shallow.  A fast heartbeat.  Feeling tired or sleepy.  Feeling confused or worried. If hypoxemia develops quickly, you will likely have dyspnea. If hypoxemia develops slowly over months or years, you may not notice any symptoms. How is this diagnosed? This condition is diagnosed by:  A physical exam.  Blood tests.  A test that measures the percentage of oxygen in your blood (pulse oximetry). This is done with a sensor that is placed on your finger, toe, or earlobe. How is this treated? Treatment for this condition depends on the underlying cause of your  hypoxemia. You will likely be treated with oxygen therapy to restore your blood oxygen level. Depending on the cause of your hypoxemia, you may need oxygen therapy for a short time (weeks or months), or you may need it for the rest of your life. Your health care provider may also recommend other therapies to treat the underlying cause of your hypoxemia. Follow these instructions at home:   Take over-the-counter and prescription medicines only as told by your health care provider.  If you are on oxygen therapy, follow oxygen safety precautions as directed by your health care provider. These may include: ? Always having a backup supply of oxygen. ? Not allowing anyone to smoke or have a fire around oxygen. ? Handling oxygen tanks carefully and as instructed.  Do not use any products that contain nicotine or tobacco, such as cigarettes and e-cigarettes. If you need help quitting, ask your health care provider. Stay away from people who smoke.  Keep all follow-up visits as told by your health care provider. This is important. Contact a health care provider if:  You have any concerns about your oxygen therapy.  You have trouble breathing, even during or after treatment.  You become short of breath when you exercise.  You are tired when you wake up.  You have a headache when you wake up. Get help right away if:  Your shortness of breath gets worse,  especially with normal or minimal activity.  You have a bluish color of the skin, lips, or nail beds.  You become confused or you cannot think properly.  You cough up dark mucus or blood.  You have chest pain.  You have a fever. Summary  Hypoxemia occurs when the blood does not contain enough oxygen.  Hypoxemia may or may not cause symptoms. Often, the main symptom is shortness of breath (dyspnea).  Depending on the cause of your hypoxemia, you may need oxygen therapy for a short time (weeks or months), or you may need it for the  rest of your life.  If you are on oxygen therapy, follow oxygen safety precautions as directed by your health care provider. This information is not intended to replace advice given to you by your health care provider. Make sure you discuss any questions you have with your health care provider. Document Revised: 08/08/2018 Document Reviewed: 09/21/2016 Elsevier Patient Education  2020 [employer].

## 2020-10-20 NOTE — Progress Notes (Addendum)
SATURATION QUALIFICATIONS: (This note is used to comply with regulatory documentation for home oxygen)  Patient Saturations on Room Air at Rest = 88%  Patient Saturations on Room Air while Ambulating = 86%  Patient Saturations on 3 Liters of oxygen while Ambulating = 90%  Please briefly explain why patient needs home oxygen: patient requires 3L of oxygen while ambulating to maintain O2 saturations at an appropriate level.

## 2020-10-20 NOTE — TOC Transition Note (Addendum)
Transition of Care [facility]) - CM/SW Discharge Note   Patient Details  Name: Chad Bullock MRN: [id number] Date of Birth: 1941/03/17  Transition of Care [facility]) CM/SW Contact:  Trish Mage, LCSW Phone Number: 10/20/2020, 11:09 AM   Clinical Narrative:   Patient scheduled for d/c today is in need of home O2, has a ride home.  SAT note and order seen and appreciated.  Contacted Caryl Pina with [employer] who will arrange for delivery of travel cannister, home unit.  No further needs identified.  TOC sign off.    Final next level of care: Home/Self Care Barriers to Discharge: No Barriers Identified   Patient Goals and CMS Choice        Discharge Placement                       Discharge Plan and Services                                     Social Determinants of Health (SDOH) Interventions     Readmission Risk Interventions No flowsheet data found.

## 2020-10-20 NOTE — Progress Notes (Signed)
Discharge instructions reviewed with Griffyn and his wife. Pt escorted to private vehicle by nursing staff. All questions answered.

## 2020-10-20 NOTE — Care Management Important Message (Signed)
Important Message  Patient Details IM Letter given to the Patient. Name: Chad Bullock MRN: [id number] Date of Birth: [birth year]   Medicare Important Message Given:  Yes     Kerin Salen 10/20/2020, 12:14 PM

## 2020-10-20 NOTE — Discharge Summary (Signed)
Discharge Summary  Zymarion Favorite HYQ:[id number] DOB: 06/27/1941  PCP: Arlyss Repress, MD  Admit date: 10/16/2020 Discharge date: 10/20/2020  Time spent: 35 minutes  Recommendations for Outpatient Follow-up:  1. Follow-up with your PCP 2. Take your medications as prescribed 3. Please quarantine for 14 days from 10/16/2020. 4. Take your medications as prescribed.  Discharge Diagnoses:  Active Hospital Problems   Diagnosis Date Noted  . COVID-19 virus infection 10/16/2020  . Type 2 diabetes mellitus with hyperglycemia, with long-term current use of insulin ([place]) 10/17/2020  . Rheumatoid arthritis ([place]) 10/17/2020  . 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 Essential hypertension 10/17/2020  . Coronary artery disease without angina pectoris 10/17/2020  . Atrial flutter ([place]) 10/17/2020  . Adenocarcinoma of prostate ([place]) 10/17/2020  . Acute respiratory failure with hypoxia ([place]) 10/17/2020    Resolved Hospital Problems  No resolved problems to display.    Discharge Condition: Stable.  Diet recommendation: Resume previous diet.  Vitals:   10/20/20 0535 10/20/20 1103  BP:  134/78  Pulse:  78  Resp:    Temp:    SpO2: 90%     History of present illness:  79 year old male with past medical history of rheumatoid arthritis, atrial flutter(S/P ablation, on eliquis),hypertension, coronary artery disease(S/P CABG),insulin-dependent diabetes mellitus type 2, obesity, prostate cancer who presents to [facility] long hospital medical floor as a transfer from [employer] for COVID-19 infection. His symptoms started a proximately 9 days ago with cough, worsening shortness of breath and generalized malaise. Multiple sick contacts in household. Home COVID-19 test was positive. Initially tried conservative management at home and presented to [facility] with worsening shortness of breath and hypoxia.Patient is unvaccinated. Started on steroid and remdesivir.  10/20/20:  Seen and  examined.  No acute events overnight.  He has no new complaints.  He is eager to go home.   Hospital Course:  Principal Problem:   COVID-19 virus infection Active Problems:   Type 2 diabetes mellitus with hyperglycemia, with long-term current use of insulin (HCC)   Rheumatoid arthritis (HCC)   Prolonged QT interval   Essential hypertension   Coronary artery disease without angina pectoris   Atrial flutter (HCC)   Adenocarcinoma of prostate (HCC)   Acute respiratory failure with hypoxia (HCC)  Acute hypoxic respiratory failure secondary to COVID-19 viral infection and atelectasis No clear evidence of COVID-19 viral pneumonia on chest x-ray Not on oxygen supplementation at baseline currently requiring 3L to maintain O2 saturation greater than 90%  Completed 5 days of remdesivir Continue prednisone taper and bronchodilators Continue to use incentive spirometer. Continue vitamin C, D3, and zinc.  COVID-19 viral infection Completed 5 days of antiviral remdesivir. Continue steroid and bronchodilators Continue vitamin C, D3 and zinc.  Type 2 diabetes with hyperglycemia Exacerbated by high doses of IV steroids. Hemoglobin A1c 7.6 on 10/17/2020. Resume home regimen. Follow-up with your PCP  QTC prolongation Avoid QTC prolonging agents Follow-up with your PCP.  Rheumatoid arthritis. No acute issues. -Continue home dose of Plaquenil.  Essential hypertension. BP at goal, 134/78, heart rate 78, respiratory rate 20. -Continue home antihypertensives which include HCTZ, lisinopril and metoprolol. Follow-up with your PCP.  History of CAD. Denies any anginal symptoms at the time of this exam. -Continue beta-blocker and statin  History of atrial flutter.S/p ablation and 06/2019. Currently in sinus rhythm. -Continue home dose of Eliquis for primary CVA prevention.  Adenocarcinoma of prostate. -Outpatient urology follow-up.      Code Status: Full  code.  Consultants:  None.  Procedures:  None.  Antimicrobials:  None.  DVT prophylaxis: Eliquis.  Discharge Exam: BP 134/78   Pulse 78   Temp 97.7 F (36.5 C) (Oral)   Resp 19   Ht 5\' 9"  (1.753 m)   Wt 102.1 kg   SpO2 90%   BMI 33.23 kg/m  . General: 79 y.o. year-old male well developed well nourished in no acute distress.  Alert and oriented x3. . Cardiovascular: Regular rate and rhythm with no rubs or gallops.  No thyromegaly or JVD noted.   Marland Kitchen Respiratory: Clear to auscultation with no wheezes or rales. Good inspiratory effort. . Abdomen: Soft nontender nondistended with normal bowel sounds x4 quadrants. . Musculoskeletal: No lower extremity edema bilaterally.   Marland Kitchen Psychiatry: Mood is appropriate for condition and setting  Discharge Instructions You were cared for by a hospitalist during your hospital stay. If you have any questions about your discharge medications or the care you received while you were in the hospital after you are discharged, you can call the unit and asked to speak with the hospitalist on call if the hospitalist that took care of you is not available. Once you are discharged, your primary care physician will handle any further medical issues. Please note that NO REFILLS for any discharge medications will be authorized once you are discharged, as it is imperative that you return to your primary care physician (or establish a relationship with a primary care physician if you do not have one) for your aftercare needs so that they can reassess your need for medications and monitor your lab values.   Allergies as of 10/20/2020      Reactions   Iodinated Diagnostic Agents Hives, Rash   Amoxicillin-pot Clavulanate Diarrhea, Nausea And Vomiting      Medication List    STOP taking these medications   chlorpheniramine-HYDROcodone 10-8 MG/5ML Suer Commonly known as: Tussionex Pennkinetic ER   diclofenac 75 MG EC tablet Commonly known as:  VOLTAREN     TAKE these medications   albuterol 108 (90 Base) MCG/ACT inhaler Commonly known as: VENTOLIN HFA Inhale 2 puffs into the lungs every 6 (six) hours as needed for wheezing or shortness of breath.   apixaban 5 MG Tabs tablet Commonly known as: ELIQUIS Take 5 mg by mouth 2 (two) times daily.   ascorbic acid 500 MG tablet Commonly known as: VITAMIN C Take 1 tablet (500 mg total) by mouth daily.   benzonatate 100 MG capsule Commonly known as: TESSALON Take 1 capsule (100 mg total) by mouth every 8 (eight) hours.   gabapentin 100 MG capsule Commonly known as: NEURONTIN Take 100 mg by mouth 3 (three) times daily.   glipiZIDE 5 MG tablet Commonly known as: GLUCOTROL Take 5 mg by mouth 2 (two) times daily before a meal.   hydrochlorothiazide 12.5 MG capsule Commonly known as: MICROZIDE Take 12.5 mg by mouth daily.   hydroxychloroquine 200 MG tablet Commonly known as: PLAQUENIL Take 200 mg by mouth 2 (two) times daily.   lisinopril 20 MG tablet Commonly known as: ZESTRIL Take 20 mg by mouth daily.   metFORMIN 500 MG 24 hr tablet Commonly known as: GLUCOPHAGE-XR Take 500 mg by mouth daily with breakfast.   metoprolol succinate 50 MG 24 hr tablet Commonly known as: TOPROL-XL Take 50 mg by mouth daily. Take with or immediately following a meal.   pantoprazole 40 MG tablet Commonly known as: Protonix Take 1 tablet (40 mg total) by mouth daily.  predniSONE 10 MG tablet Commonly known as: DELTASONE Take 1 tablet (10 mg total) by mouth daily. Take 40 mg daily x2 days, then, Take 30 mg daily x2 days, then, Take 20 mg daily x2 days, then, Take 10 mg daily x2 days, then, Take 5 mg daily x2 days, then, stop.   simvastatin 20 MG tablet Commonly known as: ZOCOR Take 20 mg by mouth daily.   TOUJEO MAX SOLOSTAR [place] Inject 16 Units into the skin daily.   Trulicity 1.5 HE/1.7EY Sopn Generic drug: Dulaglutide Inject 1.5 mg into the skin once a week. wednesday    zinc sulfate 220 (50 Zn) MG capsule Take 1 capsule (220 mg total) by mouth daily.            Durable Medical Equipment  (From admission, onward)         Start     Ordered   10/20/20 1032  For home use only DME oxygen  Once       Question Answer Comment  Length of Need 6 Months   Mode or (Route) Nasal cannula   Liters per Minute 3   Frequency Continuous (stationary and portable oxygen unit needed)   Oxygen conserving device Yes   Oxygen delivery system Gas      10/20/20 1031         Allergies  Allergen Reactions  . Iodinated Diagnostic Agents Hives and Rash  . Amoxicillin-Pot Clavulanate Diarrhea and Nausea And Vomiting    Follow-up Information    Arlyss Repress, MD. Call in 1 day(s).   Specialty: Internal Medicine Why: Please call for a post hospital follow-up appointment.               The results of significant diagnostics from this hospitalization (including imaging, microbiology, ancillary and laboratory) are listed below for reference.    Significant Diagnostic Studies: DG CHEST PORT 1 VIEW  Result Date: 10/19/2020 CLINICAL DATA:  COVID-19, cough, dyspnea, malaise EXAM: PORTABLE CHEST 1 VIEW COMPARISON:  10/16/2020 chest radiograph. FINDINGS: Intact sternotomy wires. Stable cardiomediastinal silhouette with normal heart size. No pneumothorax. No pleural effusion. No pulmonary edema. Minimal wispy curvilinear bibasilar lung opacities, similar. IMPRESSION: Minimal wispy bibasilar lung opacities, similar, favor minimal scarring versus atelectasis. Electronically Signed   By: Ilona Sorrel M.D.   On: 10/19/2020 14:37   DG Chest Port 1 View  Result Date: 10/16/2020 CLINICAL DATA:  Shortness of breath. EXAM: PORTABLE CHEST 1 VIEW COMPARISON:  None. FINDINGS: The heart size and mediastinal contours are within normal limits. Sternotomy wires are noted. No pneumothorax or pleural effusion is noted. Both lungs are clear. The visualized skeletal structures are  unremarkable. IMPRESSION: No active disease. Electronically Signed   By: Marijo Conception M.D.   On: 10/16/2020 14:44    Microbiology: Recent Results (from the past 240 hour(s))  Blood Culture (routine x 2)     Status: None (Preliminary result)   Collection Time: 10/16/20  2:15 PM   Specimen: Right Antecubital; Blood  Result Value Ref Range Status   Specimen Description   Final    RIGHT ANTECUBITAL Performed at [facility], [address] [place], [place] [address]    Special Requests   Final    BOTTLES DRAWN AEROBIC AND ANAEROBIC Blood Culture adequate volume Performed at [facility], [address] [place], [place] [address]    Culture   Final    NO GROWTH 4 DAYS Performed at [facility], [address] [place], [place] [address]    Report Status PENDING  Incomplete  Resp Panel by RT-PCR (Flu A&B, Covid) Nasopharyngeal Swab     Status: Abnormal   Collection Time: 10/16/20  2:19 PM   Specimen: Nasopharyngeal Swab; Nasopharyngeal(NP) swabs in vial transport medium  Result Value Ref Range Status   SARS Coronavirus 2 by RT PCR POSITIVE (A) NEGATIVE Final    Comment: RESULT CALLED TO, READ BACK BY AND VERIFIED WITH: Rosemarie Ax, RN AT [id number] ON [id number] BY BOWLBY, J (NOTE) SARS-CoV-2 target nucleic acids are DETECTED.  The SARS-CoV-2 RNA is generally detectable in upper respiratory specimens during the acute phase of infection. Positive results are indicative of the presence of the identified virus, but do not rule out bacterial infection or co-infection with other pathogens not detected by the test. Clinical correlation with patient history and other diagnostic information is necessary to determine patient infection status. The expected result is Negative.  Fact Sheet for Patients: EntrepreneurPulse.com.au  Fact Sheet for Healthcare Providers: IncredibleEmployment.be  This test is not yet approved or cleared by  the [place] FDA and  has been authorized for detection and/or diagnosis of SARS-CoV-2 by FDA under an Emergency Use Authorization (EUA).  This EUA will remain in effect (meaning this  test can be used) for the duration of  the COVID-19 declaration under Section 564(b)(1) of the Act, 21 U.S.C. section 360bbb-3(b)(1), unless the authorization is terminated or revoked sooner.     Influenza A by PCR NEGATIVE NEGATIVE Final   Influenza B by PCR NEGATIVE NEGATIVE Final    Comment: (NOTE) The Xpert Xpress SARS-CoV-2/FLU/RSV plus assay is intended as an aid in the diagnosis of influenza from Nasopharyngeal swab specimens and should not be used as a sole basis for treatment. Nasal washings and aspirates are unacceptable for Xpert Xpress SARS-CoV-2/FLU/RSV testing.  Fact Sheet for Patients: EntrepreneurPulse.com.au  Fact Sheet for Healthcare Providers: IncredibleEmployment.be  This test is not yet approved or cleared by the [place] FDA and has been authorized for detection and/or diagnosis of SARS-CoV-2 by FDA under an Emergency Use Authorization (EUA). This EUA will remain in effect (meaning this test can be used) for the duration of the COVID-19 declaration under Section 564(b)(1) of the Act, 21 U.S.C. section 360bbb-3(b)(1), unless the authorization is terminated or revoked.  Performed at [facility], [address] [place], [place] [address]   Blood Culture (routine x 2)     Status: None (Preliminary result)   Collection Time: 10/16/20  2:37 PM   Specimen: BLOOD LEFT FOREARM  Result Value Ref Range Status   Specimen Description   Final    BLOOD LEFT FOREARM Performed at [facility], [address] [place], [place] [address]    Special Requests   Final    BOTTLES DRAWN AEROBIC AND ANAEROBIC Blood Culture adequate volume Performed at [facility], [address] [place], [place] [address]     Culture   Final    NO GROWTH 4 DAYS Performed at [facility], [place] [address], [place], [place] [address]    Report Status PENDING  Incomplete     Labs: Basic Metabolic Panel: Recent Labs  Lab 10/16/20 1418 10/18/20 0319 10/19/20 0312 10/20/20 0305  NA 137 137 139 138  K 4.0 4.5 3.7 3.9  CL 89* 93* 94* 92*  CO2 35* 32 33* 32  GLUCOSE 231* 318* 147* 254*  BUN 26* 35* 37*  38*  CREATININE 1.44* 1.42* 1.27* 1.27*  CALCIUM 8.6* 8.4* 8.3* 8.4*  MG  --  2.3 2.0 1.9   Liver Function Tests: Recent Labs  Lab 10/16/20 1418 10/18/20 0319 10/19/20 0312 10/20/20 0305  AST 73* 57* 47* 43*  ALT 58* 53* 46* 45*  ALKPHOS 63 58 50 59  BILITOT 0.6 0.2* 0.3 0.2*  PROT 7.3 7.0 6.4* 6.5  ALBUMIN 3.8 3.4* 3.2* 3.2*   No results for input(s): LIPASE, AMYLASE in the last 168 hours. No results for input(s): AMMONIA in the last 168 hours. CBC: Recent Labs  Lab 10/16/20 1418 10/18/20 0319 10/19/20 0312 10/20/20 0305  WBC 2.5* 4.3 4.8 3.9*  NEUTROABS 1.3* 3.6 3.6 3.2  HGB 11.2* 10.8* 10.7* 11.0*  HCT 33.8* 34.1* 33.8* 34.0*  MCV 89.9 92.7 92.1 90.4  PLT 140* 177 170 181   Cardiac Enzymes: No results for input(s): CKTOTAL, CKMB, CKMBINDEX, TROPONINI in the last 168 hours. BNP: BNP (last 3 results) No results for input(s): BNP in the last 8760 hours.  ProBNP (last 3 results) No results for input(s): PROBNP in the last 8760 hours.  CBG: Recent Labs  Lab 10/19/20 1114 10/19/20 1627 10/19/20 2144 10/20/20 0726 10/20/20 1110  GLUCAP 140* 316* 217* 300* 374*       Signed:  Kayleen Memos, MD [facility] 10/20/2020, 11:52 AM

## 2020-10-21 ENCOUNTER — Ambulatory Visit (HOSPITAL_COMMUNITY): Payer: Medicare HMO

## 2020-10-21 LAB — CULTURE, BLOOD (ROUTINE X 2)
Culture: NO GROWTH
Culture: NO GROWTH
Special Requests: ADEQUATE
Special Requests: ADEQUATE

## 2020-12-15 ENCOUNTER — Encounter (HOSPITAL_BASED_OUTPATIENT_CLINIC_OR_DEPARTMENT_OTHER): Payer: Self-pay

## 2020-12-15 ENCOUNTER — Emergency Department (HOSPITAL_BASED_OUTPATIENT_CLINIC_OR_DEPARTMENT_OTHER)
Admission: EM | Admit: 2020-12-15 | Discharge: 2020-12-15 | Disposition: A | Discharge status: Home / Self Care | Payer: Medicare HMO | Attending: Emergency Medicine | Admitting: Emergency Medicine

## 2020-12-15 ENCOUNTER — Other Ambulatory Visit: Payer: Self-pay

## 2020-12-15 DIAGNOSIS — L0291 Cutaneous abscess, unspecified: Secondary | ICD-10-CM

## 2020-12-15 DIAGNOSIS — Z79899 Other long term (current) drug therapy: Secondary | ICD-10-CM | POA: Diagnosis not present

## 2020-12-15 DIAGNOSIS — Z48817 Encounter for surgical aftercare following surgery on the skin and subcutaneous tissue: Secondary | ICD-10-CM | POA: Diagnosis present

## 2020-12-15 DIAGNOSIS — Z8546 Personal history of malignant neoplasm of prostate: Secondary | ICD-10-CM | POA: Insufficient documentation

## 2020-12-15 DIAGNOSIS — Z794 Long term (current) use of insulin: Secondary | ICD-10-CM | POA: Insufficient documentation

## 2020-12-15 DIAGNOSIS — Z7901 Long term (current) use of anticoagulants: Secondary | ICD-10-CM | POA: Insufficient documentation

## 2020-12-15 DIAGNOSIS — I10 Essential (primary) hypertension: Secondary | ICD-10-CM | POA: Insufficient documentation

## 2020-12-15 DIAGNOSIS — Z951 Presence of aortocoronary bypass graft: Secondary | ICD-10-CM | POA: Diagnosis not present

## 2020-12-15 DIAGNOSIS — Z8616 Personal history of COVID-19: Secondary | ICD-10-CM | POA: Insufficient documentation

## 2020-12-15 DIAGNOSIS — Z7984 Long term (current) use of oral hypoglycemic drugs: Secondary | ICD-10-CM | POA: Insufficient documentation

## 2020-12-15 DIAGNOSIS — E1165 Type 2 diabetes mellitus with hyperglycemia: Secondary | ICD-10-CM | POA: Insufficient documentation

## 2020-12-15 DIAGNOSIS — Z87891 Personal history of nicotine dependence: Secondary | ICD-10-CM | POA: Insufficient documentation

## 2020-12-15 DIAGNOSIS — I2510 Atherosclerotic heart disease of native coronary artery without angina pectoris: Secondary | ICD-10-CM | POA: Insufficient documentation

## 2020-12-15 MED ORDER — CEPHALEXIN 500 MG PO CAPS: 500.0000 mg | ORAL_CAPSULE | Freq: Four times a day (QID) | ORAL | 0 refills | Status: DC

## 2020-12-15 MED ORDER — CEPHALEXIN 500 MG PO CAPS: 500.0000 mg | ORAL_CAPSULE | Freq: Four times a day (QID) | ORAL | 0 refills | Status: AC

## 2020-12-15 NOTE — ED Triage Notes (Signed)
Pt c/o day of abscess to posterior neck-states started yesterday as a pimple like area-NAD-steady gait

## 2020-12-15 NOTE — Discharge Instructions (Addendum)
Like we discussed, I have attached information to this paperwork on abscesses.  I am prescribing an antibiotic called Keflex.  Please take this 4 times a day for the next 10 days.  Do not stop taking early even if you find your symptoms are improving.  If your symptoms do not improve in the next 24 to 48 hours, please follow-up with your regular doctor.  If they worsen, you can return to the emergency department for reevaluation.  It was a pleasure to meet you.

## 2020-12-15 NOTE — ED Provider Notes (Signed)
[facility] Provider Note   CSN: [id number] Arrival date & time: 12/15/20  1433     History Chief Complaint  Patient presents with  . Abscess    Chad Bullock is a 80 y.o. male.  HPI Patient is a 80 year old male with a medical history as noted below.  Patient states that yesterday he had a small pimple to his right posterior neck just along the scalp line.  He states that overnight he began to worsen.  He reports moderate pain and swelling in the region.  His wife tried to manipulate the region to remove pus but was unable to.  Denies any fevers, chills, nausea, vomiting.  No other complaints at this time.    Past Medical History:  Diagnosis Date  . 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 Coronary artery disease without angina pectoris 10/17/2020  . Diabetes mellitus without complication ([place])   . Enlarged prostate   . Essential hypertension 10/17/2020  . Hypertension   . Rheumatoid arthritis ([place]) 10/17/2020  . Type 2 diabetes mellitus with hyperglycemia, with long-term current use of insulin ([place]) 10/17/2020    Patient Active Problem List   Diagnosis Date Noted  . Type 2 diabetes mellitus with hyperglycemia, with long-term current use of insulin ([place]) 10/17/2020  . Rheumatoid arthritis ([place]) 10/17/2020  . 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 Essential hypertension 10/17/2020  . Coronary artery disease without angina pectoris 10/17/2020  . Atrial flutter ([place]) 10/17/2020  . 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 Acute respiratory failure with hypoxia ([place]) 10/17/2020  . COVID-19 virus infection 10/16/2020    Past Surgical History:  Procedure Laterality Date  . APPENDECTOMY    . CHOLECYSTECTOMY    . CORONARY ARTERY BYPASS GRAFT    . HEMORRHOID SURGERY    . PROSTATE SURGERY    . TONSILLECTOMY         Family History  Problem Relation Age of Onset  . Heart disease Neg Hx     Social History   Tobacco Use  .  Smoking status: Former Research scientist (life sciences)  . Smokeless tobacco: Never Used  Substance Use Topics  . Alcohol use: No  . Drug use: Never    Home Medications Prior to Admission medications   Medication Sig Start Date End Date Taking? Authorizing Provider  cephALEXin (KEFLEX) 500 MG capsule Take 1 capsule (500 mg total) by mouth 4 (four) times daily for 10 days. 12/15/20 12/25/20 Yes Rayna Sexton, PA-C  albuterol (VENTOLIN HFA) 108 (90 Base) MCG/ACT inhaler Inhale 2 puffs into the lungs every 6 (six) hours as needed for wheezing or shortness of breath. 10/20/20   Kayleen Memos, DO  apixaban (ELIQUIS) 5 MG TABS tablet Take 5 mg by mouth 2 (two) times daily.    [provider]  ascorbic acid (VITAMIN C) 500 MG tablet Take 1 tablet (500 mg total) by mouth daily. 10/20/20 01/18/21  Kayleen Memos, DO  benzonatate (TESSALON) 100 MG capsule Take 1 capsule (100 mg total) by mouth every 8 (eight) hours. 03/29/16   Isla Pence, MD  Dulaglutide (TRULICITY) 1.5 LO/7.5IE SOPN Inject 1.5 mg into the skin once a week. wednesday    [provider]  gabapentin (NEURONTIN) 100 MG capsule Take 100 mg by mouth 3 (three) times daily.    [provider]  glipiZIDE (GLUCOTROL) 5 MG tablet Take 5 mg by mouth 2 (two) times daily before a meal.    [provider]  hydrochlorothiazide (MICROZIDE) 12.5 MG  capsule Take 12.5 mg by mouth daily.    [provider]  hydroxychloroquine (PLAQUENIL) 200 MG tablet Take 200 mg by mouth 2 (two) times daily.    [provider]  Insulin Glargine (TOUJEO MAX SOLOSTAR [place]) Inject 16 Units into the skin daily.    [provider]  lisinopril (ZESTRIL) 20 MG tablet Take 20 mg by mouth daily.    [provider]  metFORMIN (GLUCOPHAGE-XR) 500 MG 24 hr tablet Take 500 mg by mouth daily with breakfast.    [provider]  metoprolol succinate (TOPROL-XL) 50 MG 24 hr tablet Take 50 mg by mouth daily. Take with or immediately  following a meal.    [provider]  pantoprazole (PROTONIX) 40 MG tablet Take 1 tablet (40 mg total) by mouth daily. 10/20/20 10/20/21  Kayleen Memos, DO  predniSONE (DELTASONE) 10 MG tablet Take 1 tablet (10 mg total) by mouth daily. Take 40 mg daily x2 days, then, Take 30 mg daily x2 days, then, Take 20 mg daily x2 days, then, Take 10 mg daily x2 days, then, Take 5 mg daily x2 days, then, stop. 10/20/20   Kayleen Memos, DO  simvastatin (ZOCOR) 20 MG tablet Take 20 mg by mouth daily.    [provider]  zinc sulfate 220 (50 Zn) MG capsule Take 1 capsule (220 mg total) by mouth daily. 10/20/20 01/18/21  Kayleen Memos, DO    Allergies    Iodinated diagnostic agents and Amoxicillin-pot clavulanate  Review of Systems   Review of Systems  Constitutional: Negative for chills and fever.  Gastrointestinal: Negative for nausea and vomiting.  Musculoskeletal: Positive for myalgias and neck pain.  Skin: Positive for rash.   Physical Exam Updated Vital Signs BP (!) 164/89 (BP Location: Left Arm)   Pulse (!) 101   Temp 98.9 F (37.2 C) (Oral)   Resp 20   Ht 5\' 9"  (1.753 m)   Wt 100.7 kg   SpO2 95%   BMI 32.78 kg/m   Physical Exam Vitals and nursing note reviewed.  Constitutional:      General: He is not in acute distress.    Appearance: Normal appearance. He is normal weight. He is not ill-appearing, toxic-appearing or diaphoretic.  HENT:     Head: Normocephalic and atraumatic.     Right Ear: External ear normal.     Left Ear: External ear normal.     Nose: Nose normal.     Mouth/Throat:     Mouth: Mucous membranes are moist.     Pharynx: Oropharynx is clear. No oropharyngeal exudate or posterior oropharyngeal erythema.  Eyes:     General: No scleral icterus.       Right eye: No discharge.        Left eye: No discharge.     Extraocular Movements: Extraocular movements intact.     Conjunctiva/sclera: Conjunctivae normal.  Cardiovascular:     Rate and  Rhythm: Normal rate. Rhythm irregular.     Pulses: Normal pulses.     Heart sounds: Normal heart sounds. No murmur heard. No friction rub. No gallop.      Comments: Irregularly irregular.  No murmurs, rubs, or gallops.   Pulmonary:     Effort: Pulmonary effort is normal. No respiratory distress.     Breath sounds: Normal breath sounds. No stridor. No wheezing, rhonchi or rales.  Abdominal:     General: Abdomen is flat.     Tenderness: There is no abdominal tenderness.  Musculoskeletal:        General: Tenderness present. Normal range of motion.     Cervical back: Normal range of motion and neck supple. No tenderness.  Skin:    General: Skin is warm and dry.     Findings: Erythema present.     Comments: 3 to 4 cm of induration and mild tenderness noted along the right posterior neck at the patient's scalp line.  No overlying fluctuance.  Small scab in the middle of the sites where patient attempted to squeeze the abscess.  Very mild erythema noted overlying the site.  Neurological:     General: No focal deficit present.     Mental Status: He is alert and oriented to person, place, and time.  Psychiatric:        Mood and Affect: Mood normal.        Behavior: Behavior normal.    ED Results / Procedures / Treatments   Labs (all labs ordered are listed, but only abnormal results are displayed) Labs Reviewed - No data to display  EKG None  Radiology No results found.  Procedures Procedures   Medications Ordered in ED Medications - No data to display  ED Course  I have reviewed the triage vital signs and the nursing notes.  Pertinent labs & imaging results that were available during my care of the patient were reviewed by me and considered in my medical decision making (see chart for details).    MDM Rules/Calculators/A&P                          Patient is a 80 year old male who presents the emergency department due to an abscess to the right posterior neck along the  scalp line.  He does have some mild pain and induration in the region but no overlying fluctuance.  Do not feel that I&D is warranted at this time.  Denies any systemic symptoms.  Has a history of A. fib and states he takes Eliquis and has been compliant with his medications.  Discussed hot showers and hot compresses for the region.  Will discharge on a 10-day course of Keflex.  PCP follow-up.  We discussed return precautions.  His questions were answered and he was amicable at the time of discharge.  Final Clinical Impression(s) / ED Diagnoses Final diagnoses:  Abscess   Rx / DC Orders ED Discharge Orders         Ordered    cephALEXin (KEFLEX) 500 MG capsule  4 times daily,   Status:  Discontinued        12/15/20 1522    cephALEXin (KEFLEX) 500 MG capsule  4 times daily        12/15/20 1526           Rayna Sexton, PA-C 12/15/20 1529    Luna Fuse, MD 12/20/20 1504

## 2022-11-29 IMAGING — DX DG CHEST 1V PORT
1 series · 1 of 1 positions shown · non-contrast
Comparison: 10/16/2020 chest radiograph.

CLINICAL DATA: PBDOT-OH, cough, dyspnea, malaise

EXAM:
PORTABLE CHEST 1 VIEW

[chest ap]
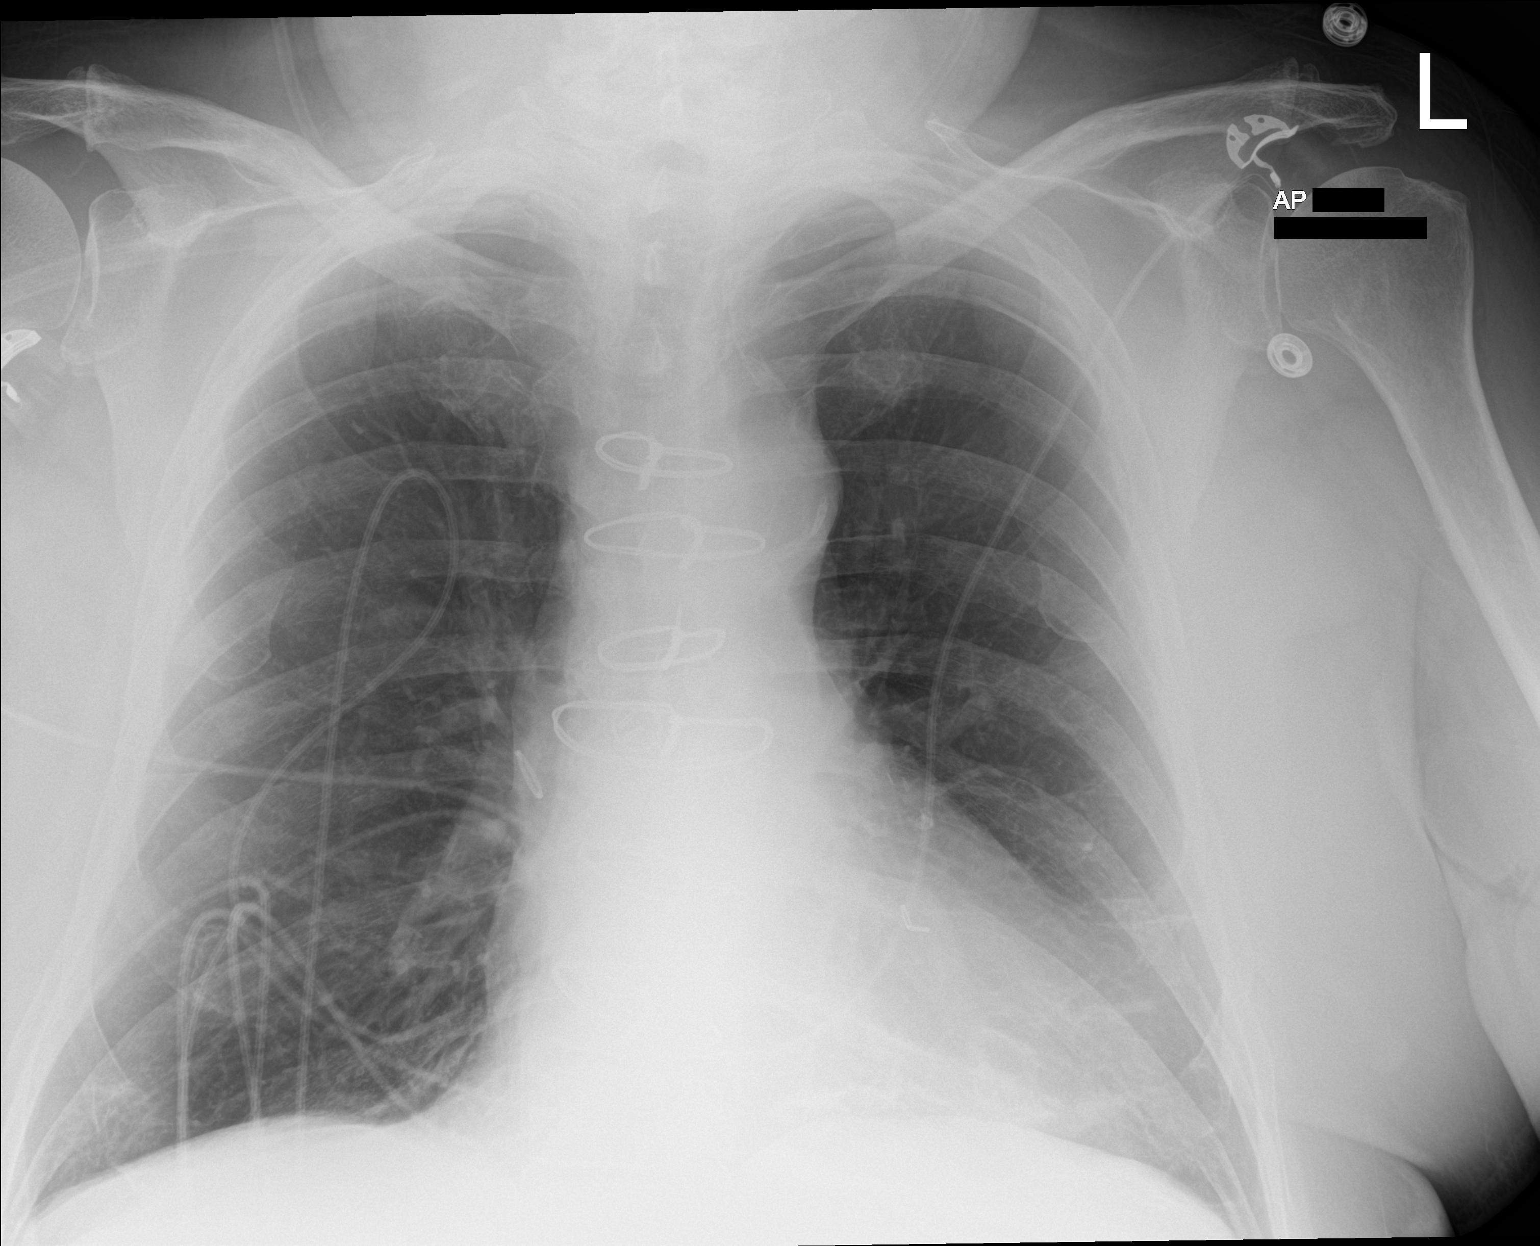

[1 of 1 positions shown; findings below may reference images not displayed]

FINDINGS: Intact sternotomy wires. Stable cardiomediastinal silhouette with
normal heart size. No pneumothorax. No pleural effusion. No
pulmonary edema. Minimal wispy curvilinear bibasilar lung opacities,
similar.
IMPRESSION: Minimal wispy bibasilar lung opacities, similar, favor minimal
scarring versus atelectasis.
